# Patient Record
Sex: Male | Born: 1970 | Race: Black or African American | Hispanic: No | Marital: Married | Smoking: Never smoker
Health system: Southern US, Community
[De-identification: ages and names within clinical notes are randomized; demographics above are authoritative.]

## PROBLEM LIST (undated history)

## (undated) DIAGNOSIS — K219 Gastro-esophageal reflux disease without esophagitis: Secondary | ICD-10-CM

## (undated) DIAGNOSIS — I10 Essential (primary) hypertension: Secondary | ICD-10-CM

## (undated) DIAGNOSIS — R51 Headache: Secondary | ICD-10-CM

## (undated) DIAGNOSIS — Z87442 Personal history of urinary calculi: Secondary | ICD-10-CM

## (undated) DIAGNOSIS — R519 Headache, unspecified: Secondary | ICD-10-CM

## (undated) HISTORY — PX: SPLENECTOMY, PARTIAL: SHX787

## (undated) HISTORY — PX: KNEE SURGERY: SHX244

---

## 1999-06-19 ENCOUNTER — Emergency Department (HOSPITAL_COMMUNITY): Admission: EM | Admit: 1999-06-19 | Discharge: 1999-06-20 | Payer: Self-pay | Admitting: Emergency Medicine

## 1999-06-20 ENCOUNTER — Encounter: Payer: Self-pay | Admitting: Emergency Medicine

## 2000-06-11 ENCOUNTER — Emergency Department (HOSPITAL_COMMUNITY): Admission: EM | Admit: 2000-06-11 | Discharge: 2000-06-11 | Payer: Self-pay | Admitting: Emergency Medicine

## 2000-06-11 ENCOUNTER — Encounter: Payer: Self-pay | Admitting: Emergency Medicine

## 2008-05-17 ENCOUNTER — Encounter: Admission: RE | Admit: 2008-05-17 | Discharge: 2008-05-17 | Payer: Self-pay | Admitting: Cardiology

## 2010-12-31 ENCOUNTER — Encounter: Payer: Self-pay | Admitting: Neurosurgery

## 2015-05-26 ENCOUNTER — Emergency Department (HOSPITAL_COMMUNITY)
Admission: EM | Admit: 2015-05-26 | Discharge: 2015-05-26 | Disposition: A | Discharge status: Other Acute Inpatient Hospital | Payer: BLUE CROSS/BLUE SHIELD | Source: Home / Self Care | Attending: Family Medicine | Admitting: Family Medicine

## 2015-05-26 ENCOUNTER — Emergency Department (HOSPITAL_COMMUNITY): Payer: BLUE CROSS/BLUE SHIELD

## 2015-05-26 ENCOUNTER — Encounter (HOSPITAL_COMMUNITY): Payer: Self-pay | Admitting: *Deleted

## 2015-05-26 ENCOUNTER — Emergency Department (HOSPITAL_COMMUNITY)
Admission: EM | Admit: 2015-05-26 | Discharge: 2015-05-27 | Disposition: A | Discharge status: Home / Self Care | Payer: BLUE CROSS/BLUE SHIELD | Attending: Emergency Medicine | Admitting: Emergency Medicine

## 2015-05-26 DIAGNOSIS — R109 Unspecified abdominal pain: Secondary | ICD-10-CM

## 2015-05-26 DIAGNOSIS — R1013 Epigastric pain: Secondary | ICD-10-CM

## 2015-05-26 DIAGNOSIS — I10 Essential (primary) hypertension: Secondary | ICD-10-CM | POA: Insufficient documentation

## 2015-05-26 DIAGNOSIS — R188 Other ascites: Secondary | ICD-10-CM | POA: Diagnosis not present

## 2015-05-26 DIAGNOSIS — Z87442 Personal history of urinary calculi: Secondary | ICD-10-CM | POA: Diagnosis not present

## 2015-05-26 DIAGNOSIS — R1033 Periumbilical pain: Secondary | ICD-10-CM | POA: Insufficient documentation

## 2015-05-26 DIAGNOSIS — Z79899 Other long term (current) drug therapy: Secondary | ICD-10-CM | POA: Insufficient documentation

## 2015-05-26 DIAGNOSIS — R100 Acute abdomen: Secondary | ICD-10-CM

## 2015-05-26 DIAGNOSIS — R1012 Left upper quadrant pain: Secondary | ICD-10-CM | POA: Diagnosis not present

## 2015-05-26 DIAGNOSIS — K3189 Other diseases of stomach and duodenum: Secondary | ICD-10-CM

## 2015-05-26 HISTORY — DX: Essential (primary) hypertension: I10

## 2015-05-26 LAB — I-STAT TROPONIN, ED
TROPONIN I, POC: 0.01 ng/mL (ref 0.00–0.08)
TROPONIN I, POC: 0.02 ng/mL (ref 0.00–0.08)

## 2015-05-26 LAB — COMPREHENSIVE METABOLIC PANEL
ALK PHOS: 53 U/L (ref 38–126)
ALT: 14 U/L — AB (ref 17–63)
AST: 18 U/L (ref 15–41)
Albumin: 3.8 g/dL (ref 3.5–5.0)
Anion gap: 8 (ref 5–15)
BUN: 9 mg/dL (ref 6–20)
CO2: 24 mmol/L (ref 22–32)
Calcium: 8.8 mg/dL — ABNORMAL LOW (ref 8.9–10.3)
Chloride: 103 mmol/L (ref 101–111)
Creatinine, Ser: 1.2 mg/dL (ref 0.61–1.24)
GFR calc Af Amer: >60 mL/min (ref >60)
GFR calc non Af Amer: >60 mL/min (ref >60)
Glucose, Bld: 110 mg/dL — ABNORMAL HIGH (ref 65–99)
Potassium: 4.1 mmol/L (ref 3.5–5.1)
SODIUM: 135 mmol/L (ref 135–145)
Total Bilirubin: 2.1 mg/dL — ABNORMAL HIGH (ref 0.3–1.2)
Total Protein: 7.1 g/dL (ref 6.5–8.1)

## 2015-05-26 LAB — CBC WITH DIFFERENTIAL/PLATELET
BASOS PCT: 0 % (ref 0–1)
Basophils Absolute: 0 10*3/uL (ref 0.0–0.1)
EOS ABS: 0 10*3/uL (ref 0.0–0.7)
Eosinophils Relative: 0 % (ref 0–5)
HCT: 40.6 % (ref 39.0–52.0)
HEMOGLOBIN: 13.9 g/dL (ref 13.0–17.0)
Lymphocytes Relative: 23 % (ref 12–46)
Lymphs Abs: 2.7 10*3/uL (ref 0.7–4.0)
MCH: 27.3 pg (ref 26.0–34.0)
MCHC: 34.2 g/dL (ref 30.0–36.0)
MCV: 79.6 fL (ref 78.0–100.0)
MONO ABS: 1.2 10*3/uL — AB (ref 0.1–1.0)
Monocytes Relative: 10 % (ref 3–12)
NEUTROS PCT: 67 % (ref 43–77)
Neutro Abs: 7.7 10*3/uL (ref 1.7–7.7)
Platelets: 410 10*3/uL — ABNORMAL HIGH (ref 150–400)
RBC: 5.1 MIL/uL (ref 4.22–5.81)
RDW: 13.5 % (ref 11.5–15.5)
WBC: 11.6 10*3/uL — ABNORMAL HIGH (ref 4.0–10.5)

## 2015-05-26 LAB — POCT URINALYSIS DIP (DEVICE)
Glucose, UA: NEGATIVE mg/dL
Ketones, ur: NEGATIVE mg/dL
Leukocytes, UA: NEGATIVE
Nitrite: NEGATIVE
Protein, ur: 30 mg/dL — AB
Specific Gravity, Urine: >=1.03 (ref 1.005–1.030)
Urobilinogen, UA: 0.2 mg/dL (ref 0.0–1.0)
pH: 5.5 (ref 5.0–8.0)

## 2015-05-26 LAB — LIPASE, BLOOD: Lipase: 19 U/L — ABNORMAL LOW (ref 22–51)

## 2015-05-26 MED ORDER — HYDROMORPHONE HCL 1 MG/ML IJ SOLN
1.0000 mg | Freq: Once | INTRAMUSCULAR | Status: AC
  Administered 2015-05-26: 1 mg via INTRAVENOUS
  Filled 2015-05-26: qty 1

## 2015-05-26 MED ORDER — HYDROMORPHONE HCL 1 MG/ML IJ SOLN
1.0000 mg | Freq: Once | INTRAMUSCULAR | Status: AC
  Administered 2015-05-26: 1 mg via INTRAMUSCULAR

## 2015-05-26 MED ORDER — HYDROMORPHONE HCL 1 MG/ML IJ SOLN
INTRAMUSCULAR | Status: AC
  Filled 2015-05-26: qty 1

## 2015-05-26 MED ORDER — OXYCODONE-ACETAMINOPHEN 5-325 MG PO TABS: 1.0000 | ORAL_TABLET | Freq: Four times a day (QID) | ORAL | Status: DC | PRN

## 2015-05-26 MED ORDER — ONDANSETRON 4 MG PO TBDP: 4.0000 mg | ORAL_TABLET | Freq: Three times a day (TID) | ORAL | Status: DC | PRN

## 2015-05-26 MED ORDER — IOHEXOL 300 MG/ML  SOLN: 25.0000 mL | Freq: Once | INTRAMUSCULAR | Status: AC | PRN

## 2015-05-26 MED ORDER — AZITHROMYCIN 250 MG PO TABS
250.0000 mg | ORAL_TABLET | Freq: Every day | ORAL | Status: DC
Start: 2015-05-26 — End: 2015-06-10

## 2015-05-26 MED ORDER — ONDANSETRON HCL 4 MG/2ML IJ SOLN
4.0000 mg | Freq: Once | INTRAMUSCULAR | Status: AC
  Administered 2015-05-26: 4 mg via INTRAVENOUS
  Filled 2015-05-26: qty 2

## 2015-05-26 MED ORDER — IOHEXOL 300 MG/ML  SOLN
100.0000 mL | Freq: Once | INTRAMUSCULAR | Status: AC | PRN
  Administered 2015-05-26: 100 mL via INTRAVENOUS

## 2015-05-26 MED ORDER — AZITHROMYCIN 250 MG PO TABS
500.0000 mg | ORAL_TABLET | Freq: Once | ORAL | Status: AC
  Administered 2015-05-26: 500 mg via ORAL
  Filled 2015-05-26: qty 2

## 2015-05-26 NOTE — ED Notes (Signed)
Pt in c/o epigastric pain that is worse with movement or coughing, pain started around 4am today, denies n/v, pt reports episodes of diaphoresis, episodes of dizziness

## 2015-05-26 NOTE — Discharge Instructions (Signed)
Please read and follow all provided instructions.  Your diagnoses today include:  1. Gastric mass   2. Epigastric pain   3. Abdominal pain, acute   4. Ascites     Tests performed today include:  Blood counts and electrolytes - slightly high white blood cell count  Blood tests to check liver and kidney function - shows normal liver function  Blood tests to check pancreas function - normal  Urine test to look for infection  CT scan - shows a mass on the stomach that measures 13cm x 10cm x 5cm with inflammation of the diaphragm and some fluid around the liver  Vital signs. See below for your results today.   Medications prescribed:   Percocet (oxycodone/acetaminophen) - narcotic pain medication  DO NOT drive or perform any activities that require you to be awake and alert because this medicine can make you drowsy. BE VERY CAREFUL not to take multiple medicines containing Tylenol (also called acetaminophen). Doing so can lead to an overdose which can damage your liver and cause liver failure and possibly death.  Take any prescribed medications only as directed.  Home care instructions:   Follow any educational materials contained in this packet.  Follow-up instructions:  Please follow-up with Dr. Erlinda Hong office tomorrow.     Return instructions:  SEEK IMMEDIATE MEDICAL ATTENTION IF:  The pain does not go away or becomes severe   A temperature above 101F develops   Repeated vomiting occurs (multiple episodes)   The pain becomes localized to portions of the abdomen. The right side could possibly be appendicitis. In an adult, the left lower portion of the abdomen could be colitis or diverticulitis.   Blood is being passed in stools or vomit (bright red or black tarry stools)   You develop chest pain, difficulty breathing, dizziness or fainting, or become confused, poorly responsive, or inconsolable (young children)  If you have any other emergent concerns regarding  your health  Additional Information: Abdominal (belly) pain can be caused by many things. Your caregiver performed an examination and possibly ordered blood/urine tests and imaging (CT scan, x-rays, ultrasound). Many cases can be observed and treated at home after initial evaluation in the emergency department. Even though you are being discharged home, abdominal pain can be unpredictable. Therefore, you need a repeated exam if your pain does not resolve, returns, or worsens. Most patients with abdominal pain don't have to be admitted to the hospital or have surgery, but serious problems like appendicitis and gallbladder attacks can start out as nonspecific pain. Many abdominal conditions cannot be diagnosed in one visit, so follow-up evaluations are very important.  Your vital signs today were: BP 142/86 mmHg   Pulse 77   Temp(Src) 98 F (36.7 C) (Oral)   Resp 12   SpO2 97% If your blood pressure (bp) was elevated above 135/85 this visit, please have this repeated by your doctor within one month. --------------

## 2015-05-26 NOTE — ED Provider Notes (Signed)
CSN: [id number]     Arrival date & time 05/26/15  1755 History   First MD Initiated Contact with Patient 05/26/15 1812     Chief Complaint  Patient presents with  . 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 Pain is constant. Pain woke patient from sleep. Patient went on to work this morning and the symptoms got worse so he went to urgent care and was referred to the emergency department for further evaluation. Patient denies fever, nausea, vomiting, diarrhea. Patient's urine appears dark but he has no dysuria or gross hematuria. Patient had 2 episodes of diaphoresis with the pain while at work today. He denies chest pain however the pain is worse when he takes deep breath in or coughs. No hemoptysis or history of blood clots. No shortness of breath. Patient has had a history of kidney stones but that pain was in his flank. No heavy NSAID use. Patient does not drink. No color change of his eyes noted. Pain does not radiate to his back or shoulder blades. Patient does note that for the past several weeks he has had indigestion after eating with increased belching.  Patient is a 44 y.o. male presenting with abdominal pain. The history is provided by the patient.  Abdominal Pain Associated symptoms: no chest pain, no cough, no diarrhea, no dysuria, no fever, no nausea, no shortness of breath, no sore throat and no vomiting     Past Medical History  Diagnosis Date  . Hypertension   . Kidney stones    Past Surgical History  Procedure Laterality Date  . Splenectomy, partial    . Knee surgery     History reviewed. No pertinent family history. History  Substance Use Topics  . Smoking status: Never Smoker   . Smokeless tobacco: Not on file  . Alcohol Use: No      Review of Systems  Constitutional: Negative for fever.  HENT: Negative for rhinorrhea and sore throat.   Eyes: Negative for redness.  Respiratory: Negative for cough, shortness of breath and wheezing.   Cardiovascular: Negative for chest pain.  Gastrointestinal: Positive for abdominal pain. Negative for nausea, vomiting and diarrhea.  Genitourinary: Negative for dysuria.  Musculoskeletal: Negative for myalgias.  Skin: Negative for rash.  Neurological: Negative for headaches.      Allergies  Review of patient's allergies indicates no known allergies.  Home Medications   Prior to Admission medications   Medication Sig Start Date End Date Taking? Authorizing Provider  amLODipine-valsartan (EXFORGE) 5-320 MG per tablet Take 1 tablet by mouth daily.    Historical Provider, MD   BP 138/100 mmHg  Pulse 90  Temp(Src) 98 F (36.7 C) (Oral)  Resp 20  SpO2 100%   Physical Exam  Constitutional: He appears well-developed and well-nourished.  HENT:  Head: Normocephalic and atraumatic.  Eyes: Conjunctivae are normal. Right eye exhibits no discharge. Left eye exhibits no discharge.  Neck: Normal range of motion. Neck supple.  Cardiovascular: Normal rate, regular rhythm and normal heart sounds.   No murmur heard. Pulmonary/Chest: Effort normal and breath sounds normal. No respiratory distress. He has no wheezes. He has no rales.  Abdominal: Soft. Bowel sounds are decreased. There is tenderness in the epigastric area, periumbilical area and left upper quadrant. There is no rigidity, no rebound, no guarding, no CVA  tenderness, no tenderness at McBurney's point and negative Murphy's sign.    Neurological: He is alert.  Skin: Skin is warm and dry.  Psychiatric: He has a normal mood and affect.  Nursing note and vitals reviewed.   ED Course  Procedures (including critical care time) Labs Review Labs Reviewed  CBC WITH DIFFERENTIAL/PLATELET - Abnormal; Notable for the following:     WBC 11.6 (*)    Platelets 410 (*)    Monocytes Absolute 1.2 (*)    All other components within normal limits  LIPASE, BLOOD - Abnormal; Notable for the following:    Lipase 19 (*)    All other components within normal limits  COMPREHENSIVE METABOLIC PANEL - Abnormal; Notable for the following:    Glucose, Bld 110 (*)    Calcium 8.8 (*)    ALT 14 (*)    Total Bilirubin 2.1 (*)    All other components within normal limits  I-STAT TROPOININ, ED  I-STAT TROPOININ, ED    Imaging Review Ct Abdomen Pelvis W Contrast  05/26/2015   CLINICAL DATA:  Acute onset of epigastric abdominal pain. Initial encounter.  EXAM: CT ABDOMEN AND PELVIS WITH CONTRAST  TECHNIQUE: Multidetector CT imaging of the abdomen and pelvis was performed using the standard protocol following bolus administration of intravenous contrast.  CONTRAST:  175mL OMNIPAQUE IOHEXOL 300 MG/ML  SOLN  COMPARISON:  Abdominal radiograph performed earlier today at 6:40 p.m.  FINDINGS: Patchy bibasilar airspace opacities may reflect atelectasis or pneumonia.  There is focal circumferential wall thickening at the antrum of the stomach and pylorus, with surrounding soft tissue inflammation and prominent nodes, most compatible with a primary gastric malignancy. The nodes measure up to 9 mm in short axis.  A large amount of irregular mass is noted at the anterior aspect of the left upper quadrant, layering along the left hemidiaphragm, measuring approximately 13.4 x 10.5 x 5.5 cm. This is compatible with peritoneal carcinomatosis. Mild underlying soft tissue edema and increased vascularity are seen about the left upper quadrant.  A small amount of high density ascites is noted tracking about the abdomen and pelvis, likely reflecting the peritoneal carcinomatosis. No definite additional peritoneal implants are seen.  The liver is unremarkable in appearance. The patient is status post splenectomy. Several splenules are noted posteriorly at the left upper  quadrant. The gallbladder is unremarkable in appearance. The pancreas is grossly unremarkable, though it abuts the gastric mass. The adrenal glands are within normal limits.  A 1.0 cm stone is noted near the upper pole of the right kidney. A 5 mm stone is noted at the interpole region of the left kidney. There is no evidence of hydronephrosis. No obstructing ureteral stones are seen. No perinephric stranding is appreciated.  The small bowel is unremarkable in appearance. No acute vascular abnormalities are seen.  The appendix is grossly unremarkable in appearance, though difficult to fully characterize. There is no evidence for appendicitis. The colon is unremarkable in appearance.  The bladder is mildly distended and grossly unremarkable in appearance. The prostate remains normal in size. No inguinal lymphadenopathy is seen.  No acute osseous abnormalities are identified.  IMPRESSION: 1. Focal circumferential wall thickening at the antrum of the stomach and pylorus, with surrounding soft tissue inflammation and prominent nodes, compatible with a primary gastric malignancy. 2. Large amount of irregular mass at the anterior aspect of left upper quadrant, layering along the left hemidiaphragm, measuring approximately 13.4 x 10.5 x 5.5 cm. This is compatible with peritoneal carcinomatosis.  Underlying soft tissue edema and increased vascularity about the left upper quadrant. 3. Small amount of high density ascites noted tracking about the abdomen and pelvis, likely reflecting the peritoneal carcinomatosis. 4. Patchy bibasilar airspace opacities may reflect atelectasis or pneumonia. 5. Bilateral nonobstructing renal stones noted.  These results were called by telephone at the time of interpretation on 05/26/2015 at 10:23 pm to [facility] PA, who verbally acknowledged these results.   Electronically Signed   By: Garald Balding M.D.   On: 05/26/2015 22:29   Dg Abd Acute W/chest  05/26/2015   CLINICAL DATA:  Cold  sweats and epigastric pain  EXAM: DG ABDOMEN ACUTE W/ 1V CHEST  COMPARISON:  None.  FINDINGS: Cardiac shadow is within normal limits. Bibasilar atelectatic changes are seen. No focal confluent infiltrate is noted.  Scattered large and small bowel gas is noted. No obstructive changes are noted. There are calcifications seen bilaterally. The calcifications on the left measure approximately 7 mm in greatest dimension and likely represent left renal calculi. The calcification on the right measures 10 mm in greatest dimension and may also represent a renal calculus no bony abnormality is seen.  IMPRESSION: Nonspecific chest and abdomen. Bibasilar atelectatic changes are seen as well as changes consistent with bilateral renal calculi.   Electronically Signed   By: Inez Catalina M.D.   On: 05/26/2015 19:11     EKG Interpretation   Date/Time:  Thursday May 26 2015 18:18:08 EDT Ventricular Rate:  83 PR Interval:  158 QRS Duration: 80 QT Interval:  368 QTC Calculation: 432 R Axis:   0 Text Interpretation:  Sinus rhythm Probable anteroseptal infarct, recent  inferior lateral ST depressions No previous ECGs available Confirmed by  Wyvonnia Dusky  MD, STEPHEN [phone]) on 05/26/2015 6:30:41 PM       6:33 PM Patient seen and examined. Work-up initiated. Medications ordered. EKG reviewed. Work-up pending. Will start with acute abd series. Pain currently controlled.   Vital signs reviewed and are as follows: BP 138/100 mmHg  Pulse 90  Temp(Src) 98 F (36.7 C) (Oral)  Resp 20  SpO2 100%  8:56 PM Pt updated. Exam unchanged. Will CT. Pt agrees. Still declines pain medications. He is hungry.   11:14 PM Spoke with radiologist in regards to above findings on CT scan.  I spoke with Dr. Paulita Fujita of [facility] gastroenterology who recommends that patient call tomorrow for appointment and to plan EGD.   Patient and family informed of CT findings. Discussed plan. Patient's pain is currently controlled. They are in agreement  with the plan at this point.  Patient urged to return with worsening symptoms or other concerns. Patient verbalized understanding and agrees with plan.   BP 120/62 mmHg  Pulse 74  Temp(Src) 98 F (36.7 C) (Oral)  Resp 25  SpO2 96%  11:18 PM Repeat EKG stable.   MDM   Final diagnoses:  Epigastric pain  Abdominal pain, acute  Gastric mass  Ascites   Patient with a diagnosis of gastric mass, possible malignancy, plan outpatient GI follow-up for tissue diagnosis and referral for appropriate treatment.    Carlisle Cater, PA-C 05/26/15 2318  Ezequiel Essex, MD 05/27/15 [phone]

## 2015-05-26 NOTE — ED Notes (Signed)
Patient transported to X-ray 

## 2015-05-26 NOTE — ED Notes (Signed)
Npo

## 2015-05-26 NOTE — ED Provider Notes (Signed)
CSN: [id number]     Arrival date & time 05/26/15  1537 History   First MD Initiated Contact with Patient 05/26/15 1707     Chief Complaint  Patient presents with  . Abdominal Pain   (Consider location/radiation/quality/duration/timing/severity/associated sxs/prior Treatment) HPI Comments: 44 year old male awoke this morning approximately 0400 hrs. with pain in the epigastrium that radiates to the left hemiabdomen. Reportedly diaphoretic.  The pain does not radiate to the back. He has had no nausea vomiting or diarrhea. Coughing and taking a deep breath makes it worse. Nothing makes it better.  Patient is a 44 y.o. male presenting with abdominal pain.  Abdominal Pain Associated symptoms: no constipation, no diarrhea, no nausea and no vomiting     Past Medical History  Diagnosis Date  . Hypertension   . Kidney stones    Past Surgical History  Procedure Laterality Date  . Splenectomy, partial    . Knee surgery     History reviewed. No pertinent family history. History  Substance Use Topics  . Smoking status: Never Smoker   . Smokeless tobacco: Not on file  . Alcohol Use: No    Review of Systems  Constitutional: Negative.   HENT: Negative.   Respiratory: Negative.   Cardiovascular: Negative.   Gastrointestinal: Positive for abdominal pain. Negative for nausea, vomiting, diarrhea, constipation and abdominal distention.  Genitourinary: Negative.   Musculoskeletal: Negative.   Neurological: Negative.     Allergies  Review of patient's allergies indicates no known allergies.  Home Medications   Prior to Admission medications   Medication Sig Start Date End Date Taking? Authorizing Provider  amLODipine-valsartan (EXFORGE) 5-320 MG per tablet Take 1 tablet by mouth daily.   Yes Historical Provider, MD   BP 141/94 mmHg  Pulse 87  Temp(Src) 98 F (36.7 C) (Oral)  Resp 24  SpO2 99% Physical Exam  Constitutional: He is oriented to person, place, and time. He appears  well-developed and well-nourished. He appears distressed.  Appears to be in moderate to severe pain. Difficult to remain still.   Neck: Normal range of motion.  Cardiovascular: Normal rate, regular rhythm and normal heart sounds.   Pulmonary/Chest: Effort normal and breath sounds normal. No respiratory distress. She has no wheezes.  Abdominal: She exhibits no distension. There is tenderness. There is guarding. There is no rebound.  Bowel sounds hypoactive. Patient is guarding in the epigastrium and left hemiabdomen. Right abdomen soft and nontender to mild tenderness. Percussion produces pain to the epigastrium and left hemiabdomen.  Musculoskeletal: She exhibits no edema.  Neurological: She is alert and oriented to person, place, and time.  Skin: Skin is warm. He is not diaphoretic.  Psychiatric: She has a normal mood and affect.  Nursing note and vitals reviewed.   ED Course  Procedures (including critical care time) Labs Review Labs Reviewed - No data to display  Imaging Review No results found.   MDM   1. Acute abdominal pain    Transfer to [place] via shuttle for acute abdominal pain with peritoneal signs. Dilaudid 1 mg IM    Janne Napoleon, NP 05/26/15 1727

## 2015-05-26 NOTE — ED Notes (Signed)
Patient transported to CT 

## 2015-05-26 NOTE — ED Notes (Signed)
Pt  Reports  Epigastric  Pain  Started  This  Am   Pt became  Pale  /  Sweaty       Symptoms  Began   this  Am            No   Vomiting   No   Diarrhea

## 2015-05-27 ENCOUNTER — Encounter: Payer: Self-pay | Admitting: *Deleted

## 2015-05-27 DIAGNOSIS — C169 Malignant neoplasm of stomach, unspecified: Secondary | ICD-10-CM

## 2015-05-27 NOTE — [facility] Oncology Navigator Note (Signed)
Received referral via inbasket from Dr. Ezequiel Essex, ED physician for oncology referral for probable gastric cancer. Dr. Paulita Fujita was also notified to schedule him for EGD. Will need tissue diagnosis.

## 2015-06-01 ENCOUNTER — Encounter: Payer: Self-pay | Admitting: *Deleted

## 2015-06-01 NOTE — [facility] Oncology Navigator Note (Signed)
Left VM with Carmell Austria at Dr. Erlinda Hong office requesting EGD procedure report/pathology. Received referral from ER physician to see medical oncology.  May be able to see him on 7/1 at [facility] [place].

## 2015-06-09 ENCOUNTER — Telehealth: Payer: Self-pay | Admitting: *Deleted

## 2015-06-09 NOTE — Telephone Encounter (Signed)
Oncology Nurse Navigator Documentation  Oncology Nurse Navigator Flowsheets 06/09/2015  Referral date to RadOnc/MedOnc 05/27/2015  Navigator Encounter Type Introductory phone call  Barriers/Navigation Needs No barriers at this time  Time Spent with Patient 10  Called patient to confirm his appointment tomorrow at [facility]-unable to come in for [place] visit with Dr. Barry Dienes.  Does not have coverage until 0900 tomorrow. Instructed him to come at 0930 and he will see Dr. Burr Medico 1st then surgeon afterwards. He understands and agrees-gave him my contact # in case he is not able to get here.

## 2015-06-10 ENCOUNTER — Ambulatory Visit (HOSPITAL_BASED_OUTPATIENT_CLINIC_OR_DEPARTMENT_OTHER): Payer: BLUE CROSS/BLUE SHIELD | Admitting: Hematology

## 2015-06-10 ENCOUNTER — Ambulatory Visit: Payer: BLUE CROSS/BLUE SHIELD | Admitting: Physical Therapy

## 2015-06-10 ENCOUNTER — Ambulatory Visit: Payer: BLUE CROSS/BLUE SHIELD | Admitting: Nutrition

## 2015-06-10 ENCOUNTER — Ambulatory Visit: Payer: BLUE CROSS/BLUE SHIELD

## 2015-06-10 ENCOUNTER — Telehealth: Payer: Self-pay | Admitting: Hematology

## 2015-06-10 ENCOUNTER — Encounter: Payer: Self-pay | Admitting: *Deleted

## 2015-06-10 VITALS — BP 153/94 | HR 79 | Temp 98.8°F | Resp 16 | Ht 74.0 in | Wt 206.8 lb

## 2015-06-10 DIAGNOSIS — R1902 Left upper quadrant abdominal swelling, mass and lump: Secondary | ICD-10-CM

## 2015-06-10 DIAGNOSIS — K3189 Other diseases of stomach and duodenum: Secondary | ICD-10-CM

## 2015-06-10 DIAGNOSIS — K319 Disease of stomach and duodenum, unspecified: Secondary | ICD-10-CM

## 2015-06-10 NOTE — [facility] Oncology Navigator Note (Signed)
Oncology Nurse Navigator Documentation  Oncology Nurse Navigator Flowsheets 06/10/2015  Referral date to RadOnc/MedOnc -  Navigator Encounter Type Clinic/[facility]  Patient Visit Type Initial visit w/medical oncology and surgery  Treatment Phase Abnormal Scans  Barriers/Navigation Needs Education-PET scan, tumor markers  Time Spent with Patient 30  Met with patient after new clinic patient visit. Explained the role of the GI Nurse Navigator and provided New Patient Packet with information on: 1. Anatomy of stomach-he declined any info on cancer at this time 2. Support groups 3. Advanced Directives 4. Fall Safety Plan Answered questions, reviewed current treatment plan using TEACH back and provided emotional support. Provided copy of current treatment plan. Was seen by CSW and Dietician today.  Merceda Elks, RN, BSN [facility] [place]

## 2015-06-10 NOTE — Progress Notes (Signed)
Patient was seen in [facility].  44 year old male diagnosed with gastric mass.  He is a patient of Dr. Burr Medico.  Past medical history includes HTN and kidney stones.  Medications include zofran and prilosec.  Labs include Glucose of 110 on 6/16.  Height: 6'2". Weight: 206.8 pounds. UBW: 235 pounds. BMI: 26.54.  Patient seen in [facility]. States he started to eliminate concentrated sweets and sodas in March because he wanted to lose weight to help lower his blood pressure. Patient has lost 28 pounds since March. Reports some pain and early satiety.  States he belches a lot after eating.  Nutrition Diagnosis:   Food and Nutrition Related Knowledge Deficit related to gastric mass as evidenced by no prior need for nutrition related information.  Intervention: Patient educated to consume small, frequent meals and snacks to promote weight maintenance. Provided fact sheet on increasing calories and protein. Questions answered and teach back method used.  Monitoring, Evaluation, and Goals: Patient will increase oral intake to maintain weight.  Next Visit:  To be scheduled as needed.

## 2015-06-10 NOTE — Progress Notes (Signed)
[place]  Telephone:(336) [phone] Fax:(336) [phone]  Clinic New Consult Note   No care team member to display 06/10/2015  CHIEF COMPLAINTS/PURPOSE OF CONSULTATION:  Abnormal CT scan   HISTORY OF PRESENTING ILLNESS:  Johnny Barker 44 y.o. male is here because of a recent abnormal CT scan, which showed a left upper quadrant abdominal mass.  He presented with sudden onset severe epigastric pain with radiation to his left abdomen started at 4:00 on 05/26/2015. It subsided after a few hours, and then he went to his work. Due to the morning he had a few episodes of diaphoresis, persistent abdominal pain, so he went to urgent care and was subsequently transferred to [facility] emergency department. He underwent a CT of the abdomen, which showed a large soft tissue mass in the left upper quadrant of his abdomen. The rest of workup was negative. Patient did not want to be admitted, and was referred to gastroenterologist Dr. Paulita Fujita, who saw him the next day. He underwent upper endoscopy, which showed multiple duodenum ulcers, and biopsy was negative for malignant cells. He was started on Prilosec twice daily.  His abdominal pain has significantly improved, he is eating normally without issues. His bowel movement is normal. He denies any other symptoms, no nausea, no change of his bowel habits. no fever chills or night sweats. He intentionally lost about 20 pounds in the past 3 months, due to diet change for his hypertension.  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 He does not remember the exact reason for the surgery. He states he has some epistaxis, which resolved after splenectomy. He vaguely remember he might have some issue with his spleen (? Infarct or injury), but not certain.   MEDICAL HISTORY:  Past Medical History  Diagnosis Date  . Hypertension   . Kidney stones     SURGICAL HISTORY: Past Surgical History  Procedure Laterality Date  . Splenectomy,  partial    . Knee surgery      SOCIAL HISTORY: History   Social History  . Marital Status: Married    Spouse Name: N/A  . Number of Children: N/A  . Years of Education: N/A   Occupational History  . Not on file.   Social History Main Topics  . Smoking status: Never Smoker   . Smokeless tobacco: Not on file  . Alcohol Use: No  . Drug Use: Not on file  . Sexual Activity: Not on file   Other Topics Concern  . Not on file   Social History Narrative    FAMILY HISTORY: No family history on file.  ALLERGIES:  has No Known Allergies.  MEDICATIONS:  Current Outpatient Prescriptions  Medication Sig Dispense Refill  . amLODipine-valsartan (EXFORGE) 5-320 MG per tablet Take 1 tablet by mouth daily.    Marland Kitchen aspirin-acetaminophen-caffeine (EXCEDRIN MIGRAINE) 250-250-65 MG per tablet Take 2 tablets by mouth every 8 (eight) hours as needed for headache.    Marland Kitchen azithromycin (ZITHROMAX) 250 MG tablet Take 1 tablet (250 mg total) by mouth daily. 4 tablet 0  . cetirizine (ZYRTEC) 10 MG chewable tablet Chew 10 mg by mouth as needed for allergies.    . famotidine (PEPCID AC) 10 MG chewable tablet Chew 10 mg by mouth 2 (two) times daily as needed for heartburn.    . ondansetron (ZOFRAN ODT) 4 MG disintegrating tablet Take 1 tablet (4 mg total) by mouth every 8 (eight) hours as needed for nausea or vomiting. 10 tablet 0  .  oxyCODONE-acetaminophen (PERCOCET/ROXICET) 5-325 MG per tablet Take 1-2 tablets by mouth every 6 (six) hours as needed for severe pain. 20 tablet 0   No current facility-administered medications for this visit.    REVIEW OF SYSTEMS:   Constitutional: Denies fevers, chills or abnormal night sweats Eyes: Denies blurriness of vision, double vision or watery eyes Ears, nose, mouth, throat, and face: Denies mucositis or sore throat Respiratory: Denies cough, dyspnea or wheezes Cardiovascular: Denies palpitation, chest discomfort or lower extremity swelling Gastrointestinal:   Denies nausea, heartburn or change in bowel habits Skin: Denies abnormal skin rashes Lymphatics: Denies new lymphadenopathy or easy bruising Neurological:Denies numbness, tingling or new weaknesses Behavioral/Psych: Mood is stable, no new changes  All other systems were reviewed with the patient and are negative.  PHYSICAL EXAMINATION: ECOG PERFORMANCE STATUS: 0 - Asymptomatic BP 153/94 mmHg  Pulse 79  Temp(Src) 98.8 F (37.1 C) (Oral)  Resp 16  Ht 6\' 2"  (1.88 m)  Wt 206 lb 12.8 oz (93.804 kg)  BMI 26.54 kg/m2  SpO2 100%  GENERAL:alert, no distress and comfortable SKIN: skin color, texture, turgor are normal, no rashes or significant lesions EYES: normal, conjunctiva are pink and non-injected, sclera clear OROPHARYNX:no exudate, no erythema and lips, buccal mucosa, and tongue normal  NECK: supple, thyroid normal size, non-tender, without nodularity LYMPH:  no palpable lymphadenopathy in the cervical, axillary or inguinal LUNGS: clear to auscultation and percussion with normal breathing effort HEART: regular rate & rhythm and no murmurs and no lower extremity edema ABDOMEN:abdomen soft, non-tender and normal bowel sounds Musculoskeletal:no cyanosis of digits and no clubbing  PSYCH: alert & oriented x 3 with fluent speech NEURO: no focal motor/sensory deficits  LABORATORY DATA:  I have reviewed the data as listed Lab Results  Component Value Date   WBC 11.6* 05/26/2015   HGB 13.9 05/26/2015   HCT 40.6 05/26/2015   MCV 79.6 05/26/2015   PLT 410* 05/26/2015    Recent Labs  05/26/15 1930  NA 135  K 4.1  CL 103  CO2 24  GLUCOSE 110*  BUN 9  CREATININE 1.20  CALCIUM 8.8*  GFRNONAA >60  GFRAA >60  PROT 7.1  ALBUMIN 3.8  AST 18  ALT 14*  ALKPHOS 53  BILITOT 2.1*   PATHOLOGY Duodenum, biopsy, ulcer  05/30/2015 -Peptic 2 tendinitis with erosions. -No dysplasia or malignancy identified.  RADIOGRAPHIC STUDIES: I have personally reviewed the radiological images as  listed and agreed with the findings in the report.  Ct Abdomen Pelvis W Contrast 05/26/2015    IMPRESSION: 1. Focal circumferential wall thickening at the antrum of the stomach and pylorus, with surrounding soft tissue inflammation and prominent nodes, compatible with a primary gastric malignancy. 2. Large amount of irregular mass at the anterior aspect of left upper quadrant, layering along the left hemidiaphragm, measuring approximately 13.4 x 10.5 x 5.5 cm. This is compatible with peritoneal carcinomatosis. Underlying soft tissue edema and increased vascularity about the left upper quadrant. 3. Small amount of high density ascites noted tracking about the abdomen and pelvis, likely reflecting the peritoneal carcinomatosis. 4. Patchy bibasilar airspace opacities may reflect atelectasis or pneumonia. 5. Bilateral nonobstructing renal stones noted.  These results were called by telephone at the time of interpretation on 05/26/2015 at 10:23 pm to [facility], who verbally acknowledged these results.   Electronically Signed   By: Garald Balding M.D.   On: 05/26/2015 22:29   Upper GI endoscopy 05/30/2015 by Dr. Paulita Fujita Impression -Normal esophagus -Gastritis  -Multiple  nonbleeding duodenal ulcers. Biopsied. Microscopically no findings to support malignancy. I wonder whether patient had self-contained duodenal ulcer perforation, which might account for his CT scan findings. -Erythematous due to adenopathy -The exam was otherwise normal   ASSESSMENT & PLAN: 44 year old African-American male with past medical history of hypertension  1. Left upper quadrant abdominal mass -I reviewed his CT scan findings with patient in details -He did have a CT abdomen without contrast in 2000 for kidney stone, which showed some small nodular soft tissue density in the left upper abdomen. Although the image was not available for review, it appears the soft tissue in the left upper abdomen has significantly increased in  size -We discussed this at this could be an indolent tumor, such as low-grade non-Hodgkin's lymphoma, or benign lesions, residual spleen tissue is also possible. -I recommend to have a PET CT scan for further evaluation. An likely would recommend a needle biopsy or laparoscopic surgical biopsy, he was seen by Dr. Barry Dienes today [facility] check CEA and CA-19-9 tumor markers   2. Slightly elevated bilirubin  -He is asymptomatic, normal liver enzymes  -We'll check direct bilirubin   3. Duodenal ulcers  -His episodes of abdominal pain is likely related to self-contained duodenal ulcer perforation  -Continue PPI, follow-up with Dr. Paulita Fujita  Plan -lab today -PET -RTC in 2-3 weeks after PET  All questions were answered. The patient knows to call the clinic with any problems, questions or concerns. I spent 40 minutes counseling the patient face to face. The total time spent in the appointment was 55 minutes and more than 50% was on counseling.     Truitt Merle, MD 06/10/2015 7:19 AM

## 2015-06-10 NOTE — Progress Notes (Signed)
[facility]  Psychosocial Distress Screening Clinical Social Work  Clinical Social Work met with pt at [facility] to introduce self, explain role of CSW/Pt and Family Support team and review distress screening protocol.  Pt still has testing to determine his treatment plan. The not knowing is the most difficult and anxiety producing event for him currently. The patient scored a 2 on the Psychosocial Distress Thermometer which indicates no distress. Pt works at [employer] and has good support from him wife, extended family and his 81yo daughter. CSW reviewed resources, GI Support group and other coping techniques to assist with anxiety. Pt denies current practical concerns at this time. Pt agrees to reach out as needed and was appreciative of support today.   Clinical Social Worker follow up needed: No.  If yes, follow up plan: Johnny Barker, [place]  [facility] Phone: [phone] Fax: [phone]

## 2015-06-10 NOTE — Telephone Encounter (Signed)
Gave and printed appt sched and avs for pt for July  °

## 2015-06-11 ENCOUNTER — Encounter: Payer: Self-pay | Admitting: Hematology

## 2015-06-11 DIAGNOSIS — IMO0002 Reserved for concepts with insufficient information to code with codable children: Secondary | ICD-10-CM | POA: Insufficient documentation

## 2015-06-11 DIAGNOSIS — S3692XA Contusion of unspecified intra-abdominal organ, initial encounter: Secondary | ICD-10-CM

## 2015-06-14 ENCOUNTER — Telehealth: Payer: Self-pay | Admitting: Hematology

## 2015-06-14 ENCOUNTER — Other Ambulatory Visit: Payer: Self-pay | Admitting: *Deleted

## 2015-06-14 LAB — CEA

## 2015-06-14 LAB — BILIRUBIN,DIRECT & INDIRECT (FRACTIONATED)
BILIRUBIN INDIRECT: 0.5 mg/dL (ref 0.2–1.2)
Bilirubin, Direct: 0.3 mg/dL (ref 0.0–0.3)

## 2015-06-14 LAB — CANCER ANTIGEN 19-9: CA 19-9: 11.6 U/mL (ref <35.0)

## 2015-06-14 LAB — BILIRUBIN, TOTAL: Total Bilirubin: 0.8 mg/dL (ref 0.2–1.2)

## 2015-06-14 NOTE — Telephone Encounter (Signed)
Confirmed appointment for 07/25

## 2015-06-14 NOTE — Progress Notes (Signed)
Received call from pt, requesting to reschedule MD appt with Dr. Burr Medico until after PET scan on 7/19. POF sent to scheduler.

## 2015-06-17 ENCOUNTER — Telehealth: Payer: Self-pay | Admitting: *Deleted

## 2015-06-17 NOTE — Telephone Encounter (Signed)
Oncology Nurse Navigator Documentation  Oncology Nurse Navigator Flowsheets 06/17/2015  Referral date to RadOnc/MedOnc -  Navigator Encounter Type Telephone-1 week F/U  Patient Visit Type -  Treatment Phase -  Barriers/Navigation Needs No barriers at this time  Time Spent with Patient (Retired) 5  Notified Vidur that his lab work on 06/10/15 all returned in normal range. Confirmed his PET scan appointment and follow up with Dr. Burr Medico. He appreciates the update. Feeling well with no issues at this time.

## 2015-06-28 ENCOUNTER — Ambulatory Visit: Payer: BLUE CROSS/BLUE SHIELD | Admitting: Hematology

## 2015-06-28 ENCOUNTER — Ambulatory Visit (HOSPITAL_COMMUNITY)
Admission: RE | Admit: 2015-06-28 | Discharge: 2015-06-28 | Disposition: A | Discharge status: Home / Self Care | Payer: BLUE CROSS/BLUE SHIELD | Source: Ambulatory Visit | Attending: Hematology | Admitting: Hematology

## 2015-06-28 DIAGNOSIS — K319 Disease of stomach and duodenum, unspecified: Secondary | ICD-10-CM | POA: Insufficient documentation

## 2015-06-28 DIAGNOSIS — K3189 Other diseases of stomach and duodenum: Secondary | ICD-10-CM

## 2015-06-28 LAB — GLUCOSE, CAPILLARY: Glucose-Capillary: 78 mg/dL (ref 65–99)

## 2015-06-28 MED ORDER — FLUDEOXYGLUCOSE F - 18 (FDG) INJECTION
10.0000 | Freq: Once | INTRAVENOUS | Status: AC | PRN
  Administered 2015-06-28: 10 via INTRAVENOUS

## 2015-07-04 ENCOUNTER — Encounter: Payer: BLUE CROSS/BLUE SHIELD | Admitting: Hematology

## 2015-07-04 ENCOUNTER — Other Ambulatory Visit: Payer: Self-pay | Admitting: *Deleted

## 2015-07-04 ENCOUNTER — Telehealth: Payer: Self-pay | Admitting: Hematology

## 2015-07-04 ENCOUNTER — Telehealth: Payer: Self-pay | Admitting: *Deleted

## 2015-07-04 NOTE — Progress Notes (Signed)
No show, rescheduled  This encounter was created in error - please disregard.

## 2015-07-04 NOTE — Telephone Encounter (Signed)
Called pt & spoke with daughter about missed appt today.  Daughter reports that pt is at work & working late today & thought his appt was tomorrow.  R/s missed appt to 9:15 am tomorrow with Dr Burr Medico & she states pt should be able to make this appt.

## 2015-07-04 NOTE — Telephone Encounter (Signed)
per Janifer Adie to chge pt appt to 7/26-she will call pt to make aware

## 2015-07-05 ENCOUNTER — Encounter: Payer: Self-pay | Admitting: Hematology

## 2015-07-05 ENCOUNTER — Telehealth: Payer: Self-pay | Admitting: Hematology

## 2015-07-05 ENCOUNTER — Ambulatory Visit (HOSPITAL_BASED_OUTPATIENT_CLINIC_OR_DEPARTMENT_OTHER): Payer: BLUE CROSS/BLUE SHIELD | Admitting: Hematology

## 2015-07-05 VITALS — BP 157/94 | HR 71 | Temp 98.7°F | Resp 17 | Ht 74.0 in | Wt 206.8 lb

## 2015-07-05 DIAGNOSIS — R1902 Left upper quadrant abdominal swelling, mass and lump: Secondary | ICD-10-CM | POA: Diagnosis not present

## 2015-07-05 DIAGNOSIS — K269 Duodenal ulcer, unspecified as acute or chronic, without hemorrhage or perforation: Secondary | ICD-10-CM | POA: Diagnosis not present

## 2015-07-05 NOTE — Progress Notes (Signed)
[place]  Telephone:(336) [phone] Fax:(336) [phone]  Clinic New Consult Note   Patient Care Team: L.Donnie Coffin, MD as PCP - General (Family Medicine) Arta Silence, MD as Consulting Physician (Gastroenterology) Stark Klein, MD as Consulting Physician (General Surgery) Truitt Merle, MD as Consulting Physician (Hematology) 07/05/2015  CHIEF COMPLAINTS/PURPOSE OF CONSULTATION:  Abnormal CT scan   HISTORY OF PRESENTING ILLNESS:  Johnny Barker 44 y.o. male is here because of a recent abnormal CT scan, which showed a left upper quadrant abdominal mass.  He presented with sudden onset severe epigastric pain with radiation to his left abdomen started at 4:00 on 05/26/2015. It subsided after a few hours, and then he went to his work. Due to the morning he had a few episodes of diaphoresis, persistent abdominal pain, so he went to urgent care and was subsequently transferred to [facility] emergency department. He underwent a CT of the abdomen, which showed a large soft tissue mass in the left upper quadrant of his abdomen. The rest of workup was negative. Patient did not want to be admitted, and was referred to gastroenterologist Dr. Paulita Fujita, who saw him the next day. He underwent upper endoscopy, which showed multiple duodenum ulcers, and biopsy was negative for malignant cells. He was started on Prilosec twice daily.  His abdominal pain has significantly improved, he is eating normally without issues. His bowel movement is normal. He denies any other symptoms, no nausea, no change of his bowel habits. no fever chills or night sweats. He intentionally lost about 20 pounds in the past 3 months, due to diet change for his hypertension.  Patient with past history of splenectomy at age of 49. He does not remember the exact reason for the surgery. He states he has some epistaxis, which resolved after splenectomy. He vaguely remember he might have some issue with his spleen (? Infarct or  injury), but not certain.   INTERIM HISTORY  Johnny Barker returns for follow-up and to discuss the PET scan findings. He is doing well overall, still has subtle intermittent epigastric discomfort, no nausea, bloating, or other complaints. He has good appetite and eats well. His bowel movement is normal, he denies any melena or hematochezia. No fever, chill, night sweats or recent weight loss.  MEDICAL HISTORY:  Past Medical History  Diagnosis Date  . Hypertension   . Kidney stones     SURGICAL HISTORY: Past Surgical History  Procedure Laterality Date  . Splenectomy, partial      Age 68  . Knee surgery      SOCIAL HISTORY: History   Social History  . Marital Status: Married    Spouse Name: N/A  . Number of Children: N/A  . Years of Education: N/A   Occupational History  . Not on file.   Social History Main Topics  . Smoking status: Never Smoker   . Smokeless tobacco: Not on file  . Alcohol Use: No  . Drug Use: No  . Sexual Activity: Not on file   Other Topics Concern  . Not on file   Social History Narrative   Married, wife [name]   Adult child, Chijioke Lasser   Occupation: Freight forwarder at [employer]: Family History  Problem Relation Age of Onset  . Hypertension Mother   . Multiple sclerosis Sister   . Hypertension Maternal Grandmother     ALLERGIES:  has No Known Allergies.  MEDICATIONS:  Current Outpatient Prescriptions  Medication Sig Dispense Refill  . fluocinonide  cream (LIDEX) 5.64 % Apply 1 application topically as needed. Hasn't filled yet    . omeprazole (PRILOSEC) 20 MG capsule Take 20 mg by mouth 2 (two) times daily before a meal.    . aspirin-acetaminophen-caffeine (EXCEDRIN MIGRAINE) 250-250-65 MG per tablet Take 2 tablets by mouth every 8 (eight) hours as needed for headache.    . cetirizine (ZYRTEC) 10 MG chewable tablet Chew 10 mg by mouth as needed for allergies.    Marland Kitchen ondansetron (ZOFRAN ODT) 4 MG disintegrating tablet Take 1  tablet (4 mg total) by mouth every 8 (eight) hours as needed for nausea or vomiting. (Patient not taking: Reported on 06/10/2015) 10 tablet 0  . oxyCODONE-acetaminophen (PERCOCET/ROXICET) 5-325 MG per tablet Take 1-2 tablets by mouth every 6 (six) hours as needed for severe pain. (Patient not taking: Reported on 06/10/2015) 20 tablet 0   No current facility-administered medications for this visit.    REVIEW OF SYSTEMS:   Constitutional: Denies fevers, chills or abnormal night sweats Eyes: Denies blurriness of vision, double vision or watery eyes Ears, nose, mouth, throat, and face: Denies mucositis or sore throat Respiratory: Denies cough, dyspnea or wheezes Cardiovascular: Denies palpitation, chest discomfort or lower extremity swelling Gastrointestinal:  Denies nausea, heartburn or change in bowel habits Skin: Denies abnormal skin rashes Lymphatics: Denies new lymphadenopathy or easy bruising Neurological:Denies numbness, tingling or new weaknesses Behavioral/Psych: Mood is stable, no new changes  All other systems were reviewed with the patient and are negative.  PHYSICAL EXAMINATION: ECOG PERFORMANCE STATUS: 0 - Asymptomatic BP 157/94 mmHg  Pulse 71  Temp(Src) 98.7 F (37.1 C) (Oral)  Resp 17  Ht 6\' 2"  (1.88 m)  Wt 206 lb 12.8 oz (93.804 kg)  BMI 26.54 kg/m2  SpO2 100%  GENERAL:alert, no distress and comfortable SKIN: skin color, texture, turgor are normal, no rashes or significant lesions EYES: normal, conjunctiva are pink and non-injected, sclera clear OROPHARYNX:no exudate, no erythema and lips, buccal mucosa, and tongue normal  NECK: supple, thyroid normal size, non-tender, without nodularity LYMPH:  no palpable lymphadenopathy in the cervical, axillary or inguinal LUNGS: clear to auscultation and percussion with normal breathing effort HEART: regular rate & rhythm and no murmurs and no lower extremity edema ABDOMEN:abdomen soft, non-tender and normal bowel  sounds Musculoskeletal:no cyanosis of digits and no clubbing  PSYCH: alert & oriented x 3 with fluent speech NEURO: no focal motor/sensory deficits  LABORATORY DATA:  I have reviewed the data as listed Lab Results  Component Value Date   WBC 11.6* 05/26/2015   HGB 13.9 05/26/2015   HCT 40.6 05/26/2015   MCV 79.6 05/26/2015   PLT 410* 05/26/2015    Recent Labs  05/26/15 1930 06/10/15 1207  NA 135  --   K 4.1  --   CL 103  --   CO2 24  --   GLUCOSE 110*  --   BUN 9  --   CREATININE 1.20  --   CALCIUM 8.8*  --   GFRNONAA >60  --   GFRAA >60  --   PROT 7.1  --   ALBUMIN 3.8  --   AST 18  --   ALT 14*  --   ALKPHOS 53  --   BILITOT 2.1* 0.8  BILIDIR  --  0.3  IBILI  --  0.5   Results for CHOYA, TORNOW (MRN [id number]) as of 07/04/2015 07:32  Ref. Range 06/10/2015 12:07  CA 19-9 Latest Ref Range: <35.0 U/mL 11.6  CEA Latest Ref  Range: 0.0-5.0 ng/mL <0.5   PATHOLOGY Duodenum, biopsy, ulcer  05/30/2015 -Peptic 2 tendinitis with erosions. -No dysplasia or malignancy identified.  RADIOGRAPHIC STUDIES: I have personally reviewed the radiological images as listed and agreed with the findings in the report.  Ct Abdomen Pelvis W Contrast 05/26/2015    IMPRESSION: 1. Focal circumferential wall thickening at the antrum of the stomach and pylorus, with surrounding soft tissue inflammation and prominent nodes, compatible with a primary gastric malignancy. 2. Large amount of irregular mass at the anterior aspect of left upper quadrant, layering along the left hemidiaphragm, measuring approximately 13.4 x 10.5 x 5.5 cm. This is compatible with peritoneal carcinomatosis. Underlying soft tissue edema and increased vascularity about the left upper quadrant. 3. Small amount of high density ascites noted tracking about the abdomen and pelvis, likely reflecting the peritoneal carcinomatosis. 4. Patchy bibasilar airspace opacities may reflect atelectasis or pneumonia. 5. Bilateral  nonobstructing renal stones noted.  These results were called by telephone at the time of interpretation on 05/26/2015 at 10:23 pm to [facility] PA, who verbally acknowledged these results.   Electronically Signed   By: Garald Balding M.D.   On: 05/26/2015 22:29   Upper GI endoscopy 05/30/2015 by Dr. Paulita Fujita Impression -Normal esophagus -Gastritis  -Multiple nonbleeding duodenal ulcers. Biopsied. Microscopically no findings to support malignancy. I wonder whether patient had self-contained duodenal ulcer perforation, which might account for his CT scan findings. -Erythematous due to adenopathy -The exam was otherwise normal  PET/CT 06/28/2015 IMPRESSION: 1. Significantly improved appearance of the abdomen. Near complete resolution of left upper quadrant soft tissue fullness with resolution of complex abdominal pelvic fluid (which was likely hemorrhage on the prior CT). Suspect that the left upper quadrant amorphous soft tissue density was secondary to hematoma (possibly from gastroepiploic venous bleed). Residual left upper quadrant soft tissue density is favored to represent resolving hematoma. No evidence of gastric or perigastric hypermetabolic primary malignancy. 2. Bilateral inguinal and external iliac borderline enlarged and mild to moderately hypermetabolic nodes. These are nonspecific. Given absence of primary malignancy in the abdomen, could be reactive. Alternate considerations, including lymphoma could look similar. Consider follow-up with CT at 2-3 months. Also consider clinical/laboratory exclusion of lymphoma. 3. Bilateral nephrolithiasis. 4. [place]ine tonsil hypermetabolism, favored to be physiologic.    ASSESSMENT & PLAN: 44 year old African-American male with past medical history of hypertension  1. Left upper quadrant abdominal mass, hematoma from duodenal ulcer perforation -I discussed the PET CT scan finding from July 19 with patient in great detail. Apparently  the left upper quadrant abdominal mass has nearly resolved, which is consistent with a resolved hematoma which was seen on the prior CT. -I think the bilateral inguinal and external iliac hypermetabolic lymph nodes are likely reactive, he does not have any clinical concern for lymphoma, I suggest him to obtain a repeat CT scan in 4 months for follow up. If the repeat a CT scan is negative, he does not need to follow up with me afterwards. -His tumor markers CEA and a CA 19.9 was negative.  2. Slightly elevated bilirubin  -He is asymptomatic, normal liver enzymes  -This is resolved on repeat lab.  3. Duodenal ulcers  -His episodes of abdominal pain is likely related to self-contained duodenal ulcer perforation  -Continue PPI, follow-up with Dr. Paulita Fujita  Plan -RTC with a repeat a CT abdomen and pelvis in 4 months   All questions were answered. The patient knows to call the clinic with any problems, questions or concerns.  I spent 20 minutes counseling the patient face to face. The total time spent in the appointment was 25 minutes and more than 50% was on counseling.     Truitt Merle, MD 07/05/2015 11:57 AM

## 2015-07-05 NOTE — [facility] Oncology Navigator Note (Signed)
Oncology Nurse Navigator Documentation  Oncology Nurse Navigator Flowsheets 07/05/2015  Referral date to RadOnc/MedOnc -  Navigator Encounter Type F/U scan results  Patient Visit Type Medonc  Barriers/Navigation Needs No barriers at this time  Interventions None required  Time Spent with Patient 5  Pleased with PET and lab results. Understands need to have f/u CT scan in few months to confirm it is still resolving.

## 2015-07-05 NOTE — Telephone Encounter (Signed)
Gave and printed appt sched and avs for pt for OCT/.///gv barium °

## 2015-07-22 ENCOUNTER — Telehealth: Payer: Self-pay | Admitting: *Deleted

## 2015-07-22 NOTE — Telephone Encounter (Signed)
Yes, sounds good, thanks.  Johnny Barker

## 2015-07-22 NOTE — Telephone Encounter (Signed)
TC from [facility] in Radiology scheduling regarding CT scan scheduling for 4 month repeat scan. $ months from last scan and MD appt would be in novemeber. Radiology scheduling this appt for 10/25/15. If pt needs to see Dr. Burr Medico it would then need to be after that. Pt wants only Tuesday appts as it is his day off.  Ok for labs on 10/25/15 8am, CT scan @ 9 am  And Dr. Burr Medico (if needed) on 11/01/15?

## 2015-07-25 ENCOUNTER — Telehealth: Payer: Self-pay | Admitting: Hematology

## 2015-07-25 ENCOUNTER — Other Ambulatory Visit: Payer: Self-pay | Admitting: *Deleted

## 2015-07-25 NOTE — Telephone Encounter (Signed)
per pof to r/s pt appt-cld & left pt a message to adv of r/s time & date

## 2015-09-27 ENCOUNTER — Other Ambulatory Visit: Payer: BLUE CROSS/BLUE SHIELD

## 2015-10-04 ENCOUNTER — Ambulatory Visit: Payer: BLUE CROSS/BLUE SHIELD | Admitting: Hematology

## 2015-10-25 ENCOUNTER — Other Ambulatory Visit (HOSPITAL_BASED_OUTPATIENT_CLINIC_OR_DEPARTMENT_OTHER): Payer: BLUE CROSS/BLUE SHIELD

## 2015-10-25 ENCOUNTER — Ambulatory Visit (HOSPITAL_COMMUNITY)
Admission: RE | Admit: 2015-10-25 | Discharge: 2015-10-25 | Disposition: A | Discharge status: Home / Self Care | Payer: BLUE CROSS/BLUE SHIELD | Source: Ambulatory Visit | Attending: Hematology | Admitting: Hematology

## 2015-10-25 ENCOUNTER — Encounter (HOSPITAL_COMMUNITY): Payer: Self-pay

## 2015-10-25 DIAGNOSIS — R590 Localized enlarged lymph nodes: Secondary | ICD-10-CM | POA: Diagnosis present

## 2015-10-25 DIAGNOSIS — Z09 Encounter for follow-up examination after completed treatment for conditions other than malignant neoplasm: Secondary | ICD-10-CM | POA: Diagnosis not present

## 2015-10-25 DIAGNOSIS — R1902 Left upper quadrant abdominal swelling, mass and lump: Secondary | ICD-10-CM | POA: Insufficient documentation

## 2015-10-25 DIAGNOSIS — N200 Calculus of kidney: Secondary | ICD-10-CM | POA: Insufficient documentation

## 2015-10-25 LAB — CBC WITH DIFFERENTIAL/PLATELET
BASO%: 0.5 % (ref 0.0–2.0)
Basophils Absolute: 0 10*3/uL (ref 0.0–0.1)
EOS%: 2.6 % (ref 0.0–7.0)
Eosinophils Absolute: 0.2 10*3/uL (ref 0.0–0.5)
HEMATOCRIT: 44.7 % (ref 38.4–49.9)
HGB: 15.3 g/dL (ref 13.0–17.1)
LYMPH#: 2.9 10*3/uL (ref 0.9–3.3)
LYMPH%: 49.7 % — AB (ref 14.0–49.0)
MCH: 27.1 pg — ABNORMAL LOW (ref 27.2–33.4)
MCHC: 34.2 g/dL (ref 32.0–36.0)
MCV: 79.1 fL — ABNORMAL LOW (ref 79.3–98.0)
MONO#: 0.5 10*3/uL (ref 0.1–0.9)
MONO%: 9 % (ref 0.0–14.0)
NEUT#: 2.2 10*3/uL (ref 1.5–6.5)
NEUT%: 38.2 % — AB (ref 39.0–75.0)
Platelets: 371 10*3/uL (ref 140–400)
RBC: 5.65 10*6/uL (ref 4.20–5.82)
RDW: 15 % — ABNORMAL HIGH (ref 11.0–14.6)
WBC: 5.8 10*3/uL (ref 4.0–10.3)

## 2015-10-25 LAB — COMPREHENSIVE METABOLIC PANEL (CC13)
ALT: 19 U/L (ref 0–55)
AST: 22 U/L (ref 5–34)
Albumin: 4 g/dL (ref 3.5–5.0)
Alkaline Phosphatase: 61 U/L (ref 40–150)
Anion Gap: 7 mEq/L (ref 3–11)
BUN: 10.9 mg/dL (ref 7.0–26.0)
CHLORIDE: 108 meq/L (ref 98–109)
CO2: 26 meq/L (ref 22–29)
CREATININE: 1.1 mg/dL (ref 0.7–1.3)
Calcium: 9.1 mg/dL (ref 8.4–10.4)
EGFR: >90 mL/min/{1.73_m2} (ref >90)
Glucose: 95 mg/dl (ref 70–140)
Potassium: 4 mEq/L (ref 3.5–5.1)
Sodium: 142 mEq/L (ref 136–145)
Total Bilirubin: 1.53 mg/dL — ABNORMAL HIGH (ref 0.20–1.20)
Total Protein: 7.5 g/dL (ref 6.4–8.3)

## 2015-10-25 LAB — LACTATE DEHYDROGENASE (CC13): LDH: 179 U/L (ref 125–245)

## 2015-10-25 MED ORDER — IOHEXOL 300 MG/ML  SOLN
100.0000 mL | Freq: Once | INTRAMUSCULAR | Status: AC | PRN
  Administered 2015-10-25: 100 mL via INTRAVENOUS

## 2015-10-25 MED ORDER — IOHEXOL 300 MG/ML  SOLN
25.0000 mL | INTRAMUSCULAR | Status: AC
  Administered 2015-10-25: 25 mL via ORAL

## 2015-11-01 ENCOUNTER — Encounter: Payer: Self-pay | Admitting: Hematology

## 2015-11-01 ENCOUNTER — Encounter: Payer: BLUE CROSS/BLUE SHIELD | Admitting: Hematology

## 2015-11-01 NOTE — Progress Notes (Signed)
This encounter was created in error - please disregard.

## 2015-11-02 ENCOUNTER — Telehealth: Payer: Self-pay | Admitting: Hematology

## 2015-11-02 ENCOUNTER — Other Ambulatory Visit: Payer: Self-pay | Admitting: *Deleted

## 2015-11-02 NOTE — Telephone Encounter (Signed)
per pof to sch pt appt-cld & spoke to pt and adv pt of r/s time & date

## 2015-11-09 ENCOUNTER — Encounter: Payer: Self-pay | Admitting: Hematology

## 2015-11-09 ENCOUNTER — Ambulatory Visit (HOSPITAL_BASED_OUTPATIENT_CLINIC_OR_DEPARTMENT_OTHER): Payer: BLUE CROSS/BLUE SHIELD | Admitting: Hematology

## 2015-11-09 VITALS — BP 164/97 | HR 75 | Temp 98.9°F | Resp 18 | Ht 74.0 in | Wt 208.0 lb

## 2015-11-09 DIAGNOSIS — K261 Acute duodenal ulcer with perforation: Secondary | ICD-10-CM | POA: Diagnosis not present

## 2015-11-09 DIAGNOSIS — S3692XD Contusion of unspecified intra-abdominal organ, subsequent encounter: Secondary | ICD-10-CM

## 2015-11-09 DIAGNOSIS — R590 Localized enlarged lymph nodes: Secondary | ICD-10-CM

## 2015-11-09 DIAGNOSIS — R1902 Left upper quadrant abdominal swelling, mass and lump: Secondary | ICD-10-CM | POA: Diagnosis not present

## 2015-11-09 DIAGNOSIS — IMO0001 Reserved for inherently not codable concepts without codable children: Secondary | ICD-10-CM

## 2015-11-09 DIAGNOSIS — Z9081 Acquired absence of spleen: Secondary | ICD-10-CM

## 2015-11-09 DIAGNOSIS — R591 Generalized enlarged lymph nodes: Secondary | ICD-10-CM | POA: Insufficient documentation

## 2015-11-09 DIAGNOSIS — R599 Enlarged lymph nodes, unspecified: Secondary | ICD-10-CM | POA: Insufficient documentation

## 2015-11-09 NOTE — Progress Notes (Signed)
[place]  [phone] [phone]  Clinic follow Up Note   Patient Care Team: L.Donnie Coffin, MD as PCP - General (Family Medicine) Arta Silence, MD as Consulting Physician (Gastroenterology) Stark Klein, MD as Consulting Physician (General Surgery) Truitt Merle, MD as Consulting Physician (Hematology) 11/09/2015  CHIEF COMPLAINTS:  Follow up CT scan   HISTORY OF PRESENTING ILLNESS:  Johnny Barker 44 y.o. male is here because of a recent abnormal CT scan, which showed a left upper quadrant abdominal mass.  He presented with sudden onset severe epigastric pain with radiation to his left abdomen started at 4:00 on 05/26/2015. It subsided after a few hours, and then he went to his work. Due to the morning he had a few episodes of diaphoresis, persistent abdominal pain, so he went to urgent care and was subsequently transferred to [facility] emergency department. He underwent a CT of the abdomen, which showed a large soft tissue mass in the left upper quadrant of his abdomen. The rest of workup was negative. Patient did not want to be admitted, and was referred to gastroenterologist Dr. Paulita Fujita, who saw him the next day. He underwent upper endoscopy, which showed multiple duodenum ulcers, and biopsy was negative for malignant cells. He was started on Prilosec twice daily.  His abdominal pain has significantly improved, he is eating normally without issues. His bowel movement is normal. He denies any other symptoms, no nausea, no change of his bowel habits. no fever chills or night sweats. He intentionally lost about 20 pounds in the past 3 months, due to diet change for his hypertension.  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 He does not remember the exact reason for the surgery. He states he has some epistaxis, which resolved after splenectomy. He vaguely remember he might have some issue with his spleen (? Infarct or injury), but not  certain.   INTERIM HISTORY  Johnny Barker returns for follow-up and to discuss the repeated CT scan result. He has been doing well, denies any abdominal pain, nausea, change of his bowel habit or other complaints. He has good appetite and energy level, weight is stable. No fever or chills or night sweats.  MEDICAL HISTORY:  Past Medical History  Diagnosis Date  . Hypertension   . Kidney stones     SURGICAL HISTORY: Past Surgical History  Procedure Laterality Date  . Splenectomy, partial      Age 45  . Knee surgery      SOCIAL HISTORY: Social History   Social History  . Marital Status: Married    Spouse Name: N/A  . Number of Children: N/A  . Years of Education: N/A   Occupational History  . Not on file.   Social History Main Topics  . Smoking status: Never Smoker   . Smokeless tobacco: Not on file  . Alcohol Use: No  . Drug Use: No  . Sexual Activity: Not on file   Other Topics Concern  . Not on file   Social History Narrative   Married, wife Johnny Barker   Adult child, Johnny Barker   Occupation: Freight forwarder at [employer]: Family History  Problem Relation Age of Onset  . Hypertension Mother   . Multiple sclerosis Sister   . Hypertension Maternal Grandmother     ALLERGIES:  has No Known Allergies.  MEDICATIONS:  Current Outpatient Prescriptions  Medication Sig Dispense Refill  . aspirin-acetaminophen-caffeine (EXCEDRIN MIGRAINE) 250-250-65 MG per tablet Take 2  tablets by mouth every 8 (eight) hours as needed for headache.    . cetirizine (ZYRTEC) 10 MG chewable tablet Chew 10 mg by mouth as needed for allergies.     No current facility-administered medications for this visit.    REVIEW OF SYSTEMS:   Constitutional: Denies fevers, chills or abnormal night sweats Eyes: Denies blurriness of vision, double vision or watery eyes Ears, nose, mouth, throat, and face: Denies mucositis or sore throat Respiratory: Denies cough, dyspnea or  wheezes Cardiovascular: Denies palpitation, chest discomfort or lower extremity swelling Gastrointestinal:  Denies nausea, heartburn or change in bowel habits Skin: Denies abnormal skin rashes Lymphatics: Denies new lymphadenopathy or easy bruising Neurological:Denies numbness, tingling or new weaknesses Behavioral/Psych: Mood is stable, no new changes  All other systems were reviewed with the patient and are negative.  PHYSICAL EXAMINATION: ECOG PERFORMANCE STATUS: 0 - Asymptomatic BP 164/97 mmHg  Pulse 75  Temp(Src) 98.9 F (37.2 C) (Oral)  Resp 18  Ht 6\' 2"  (1.88 m)  Wt 208 lb (94.348 kg)  BMI 26.69 kg/m2  SpO2 100%  GENERAL:alert, no distress and comfortable SKIN: skin color, texture, turgor are normal, no rashes or significant lesions EYES: normal, conjunctiva are pink and non-injected, sclera clear OROPHARYNX:no exudate, no erythema and lips, buccal mucosa, and tongue normal  NECK: supple, thyroid normal size, non-tender, without nodularity LYMPH:  no palpable lymphadenopathy in the cervical, axillary or inguinal LUNGS: clear to auscultation and percussion with normal breathing effort HEART: regular rate & rhythm and no murmurs and no lower extremity edema ABDOMEN:abdomen soft, non-tender and normal bowel sounds Musculoskeletal:no cyanosis of digits and no clubbing  PSYCH: alert & oriented x 3 with fluent speech NEURO: no focal motor/sensory deficits  LABORATORY DATA:  I have reviewed the data as listed Lab Results  Component Value Date   WBC 5.8 10/25/2015   HGB 15.3 10/25/2015   HCT 44.7 10/25/2015   MCV 79.1* 10/25/2015   PLT 371 10/25/2015    Recent Labs  05/26/15 1930 06/10/15 1207 10/25/15 0845  NA 135  --  142  K 4.1  --  4.0  CL 103  --   --   CO2 24  --  26  GLUCOSE 110*  --  95  BUN 9  --  10.9  CREATININE 1.20  --  1.1  CALCIUM 8.8*  --  9.1  GFRNONAA >60  --   --   GFRAA >60  --   --   PROT 7.1  --  7.5  ALBUMIN 3.8  --  4.0  AST 18  --   22  ALT 14*  --  19  ALKPHOS 53  --  61  BILITOT 2.1* 0.8 1.53*  BILIDIR  --  0.3  --   IBILI  --  0.5  --    Results for Johnny Barker (MRN [id number]) as of 07/04/2015 07:32  Ref. Range 06/10/2015 12:07  CA 19-9 Latest Ref Range: <35.0 U/mL 11.6  CEA Latest Ref Range: 0.0-5.0 ng/mL <0.5   PATHOLOGY Duodenum, biopsy, ulcer  05/30/2015 -Peptic 2 tendinitis with erosions. -No dysplasia or malignancy identified.  RADIOGRAPHIC STUDIES: I have personally reviewed the radiological images as listed and agreed with the findings in the report.  Ct Abdomen Pelvis W Contrast 05/26/2015    IMPRESSION: 1. Focal circumferential wall thickening at the antrum of the stomach and pylorus, with surrounding soft tissue inflammation and prominent nodes, compatible with a primary gastric malignancy. 2. Large amount of irregular mass  at the anterior aspect of left upper quadrant, layering along the left hemidiaphragm, measuring approximately 13.4 x 10.5 x 5.5 cm. This is compatible with peritoneal carcinomatosis. Underlying soft tissue edema and increased vascularity about the left upper quadrant. 3. Small amount of high density ascites noted tracking about the abdomen and pelvis, likely reflecting the peritoneal carcinomatosis. 4. Patchy bibasilar airspace opacities may reflect atelectasis or pneumonia. 5. Bilateral nonobstructing renal stones noted.  These results were called by telephone at the time of interpretation on 05/26/2015 at 10:23 pm to [facility] PA, who verbally acknowledged these results.   Electronically Signed   By: Garald Balding M.D.   On: 05/26/2015 22:29   Upper GI endoscopy 05/30/2015 by Dr. Paulita Fujita Impression -Normal esophagus -Gastritis  -Multiple nonbleeding duodenal ulcers. Biopsied. Microscopically no findings to support malignancy. I wonder whether patient had self-contained duodenal ulcer perforation, which might account for his CT scan findings. -Erythematous due to adenopathy -The  exam was otherwise normal  PET/CT 06/28/2015 IMPRESSION: 1. Significantly improved appearance of the abdomen. Near complete resolution of left upper quadrant soft tissue fullness with resolution of complex abdominal pelvic fluid (which was likely hemorrhage on the prior CT). Suspect that the left upper quadrant amorphous soft tissue density was secondary to hematoma (possibly from gastroepiploic venous bleed). Residual left upper quadrant soft tissue density is favored to represent resolving hematoma. No evidence of gastric or perigastric hypermetabolic primary malignancy. 2. Bilateral inguinal and external iliac borderline enlarged and mild to moderately hypermetabolic nodes. These are nonspecific. Given absence of primary malignancy in the abdomen, could be reactive. Alternate considerations, including lymphoma could look similar. Consider follow-up with CT at 2-3 months. Also consider clinical/laboratory exclusion of lymphoma. 3. Bilateral nephrolithiasis. 4. Palatine tonsil hypermetabolism, favored to be physiologic.  CT abdomen and pelvis  10/25/2015  IMPRESSION:  1. Similar to improved pelvic adenopathy, consistent with a benign/reactive etiology. 2. Further improvement in appearance of the left upper quadrant. Near complete to complete resolution of previously described hematoma. 3. Bilateral nephrolithiasis.    ASSESSMENT & PLAN: 44 year old African-American male with past medical history of hypertension  1. Abdominal adenopathy, likely reactive -I reviewed his recent CT abdomen and pelvis scan result, his previously pelvic adenopathy has improved, likely benign overactive. -He is asymptomatic, no palpable peripheral adenopathy, LDH is normal, no clinical suspicion for lymphoma. -I do not think he needs follow-up CT or other workup, will observe clinically    2. Left upper quadrant abdominal mass, hematoma from duodenal ulcer perforation -This has resolved on the  recent repeat a CT scan.  3. Slightly elevated bilirubin  -He is asymptomatic, normal liver enzymes, likely benign  -I recommend him to follow-up with his primary care physician  4. Duodenal ulcers  -His episodes of abdominal pain is likely related to self-contained duodenal ulcer perforation  -Continue PPI, follow-up with Dr. Paulita Fujita  5. Bilateral nephrolithiasis -He had episode of kidney stones in the past -I reviewed the CT scan finding, and encourage him to keep well hydration  Plan -I'll only see him as needed in the future -He'll follow-up with his primary care physician  All questions were answered. The patient knows to call the clinic with any problems, questions or concerns. I spent 20 minutes counseling the patient face to face. The total time spent in the appointment was 25 minutes and more than 50% was on counseling.     Truitt Merle, MD 11/09/2015 9:27 PM

## 2016-06-29 IMAGING — CT CT ABD-PELV W/ CM
2 of 5 series · 15 of 46 positions shown, 17 images · IV contrast (APPLIED)
Comparison: Abdominal radiograph performed earlier today at [DATE]
p.m.

CLINICAL DATA: Acute onset of epigastric abdominal pain. Initial
encounter.

EXAM:
CT ABDOMEN AND PELVIS WITH CONTRAST
TECHNIQUE: Multidetector CT imaging of the abdomen and pelvis was performed
using the standard protocol following bolus administration of
intravenous contrast.
CONTRAST:  100mL OMNIPAQUE IOHEXOL 300 MG/ML  SOLN

[Series 2: abd/ pelvis 5.0 i30f 1 · axial · 0.71mm/px · z∈[+895,+1365]mm · 12 of 106 slices shown, 14 images]
[im 6/106  soft-tissue]
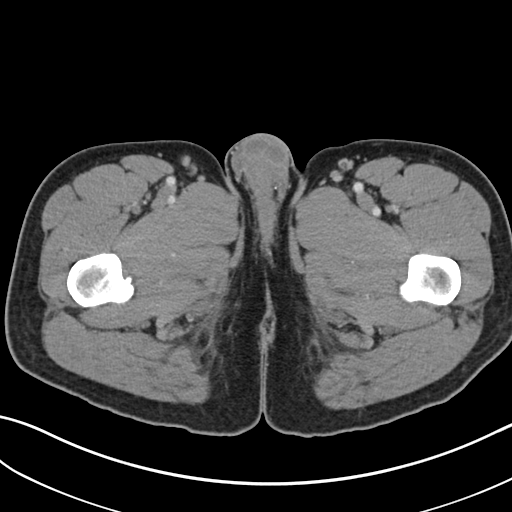
[im 6/106  bone]
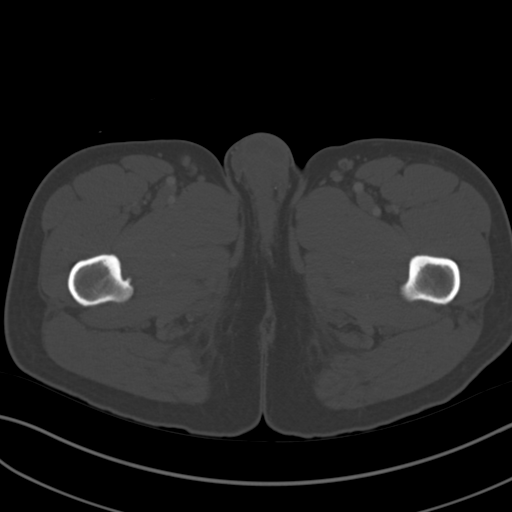
[im 17/106  soft-tissue]
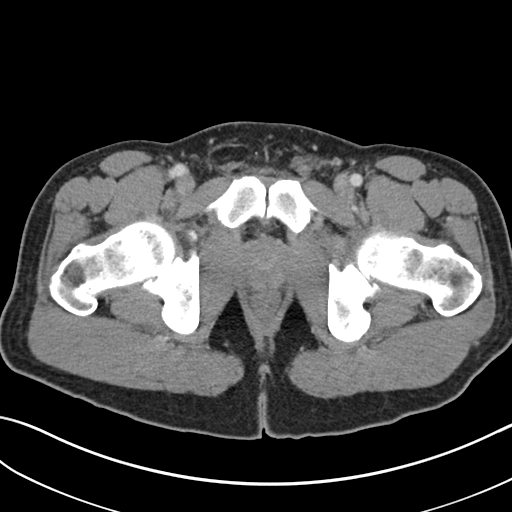
[im 23/106  soft-tissue]
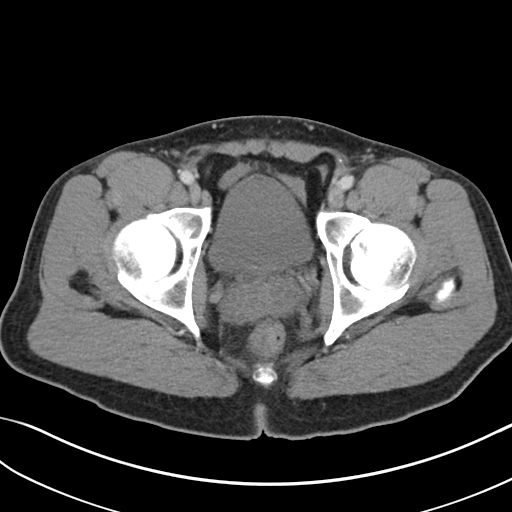
[im 34/106  soft-tissue]
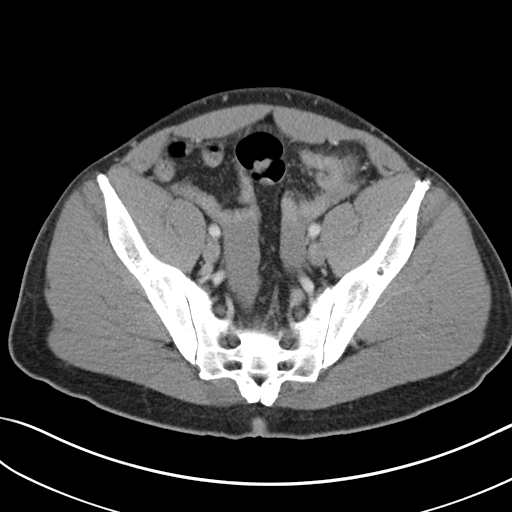
[im 39/106  soft-tissue]
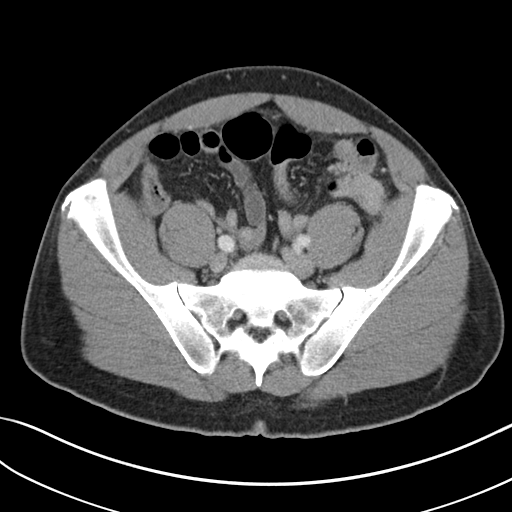
[im 50/106  soft-tissue]
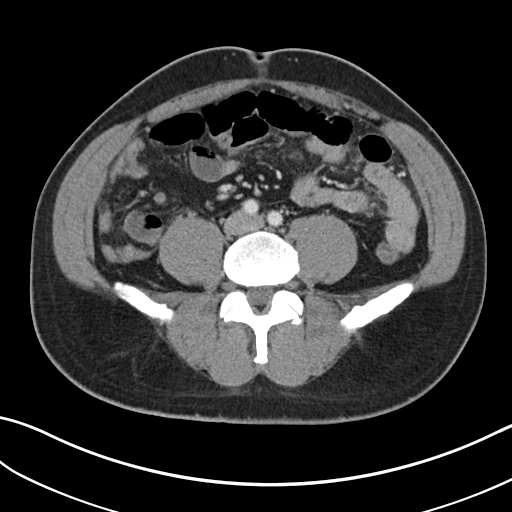
[im 56/106  soft-tissue]
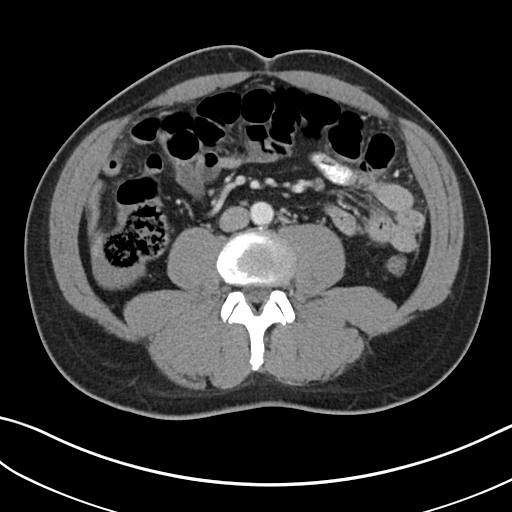
[im 67/106  soft-tissue]
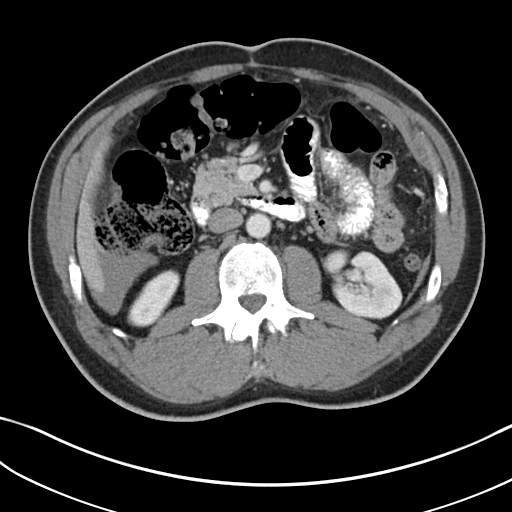
[im 72/106  soft-tissue]
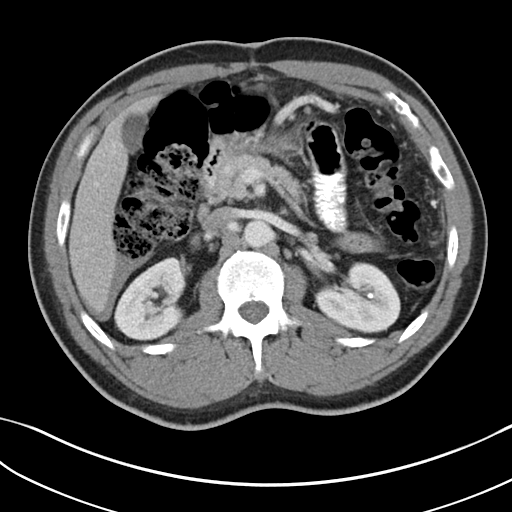
[im 72/106  bone]
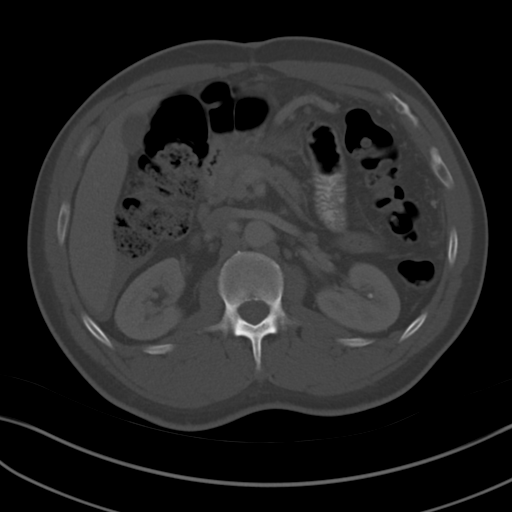
[im 83/106  soft-tissue]
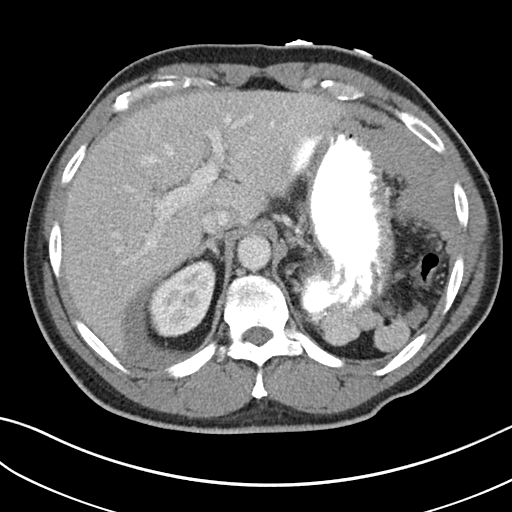
[im 89/106  soft-tissue]
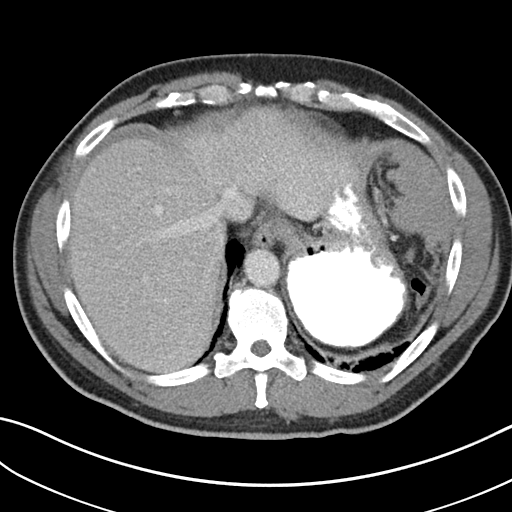
[im 100/106  soft-tissue]
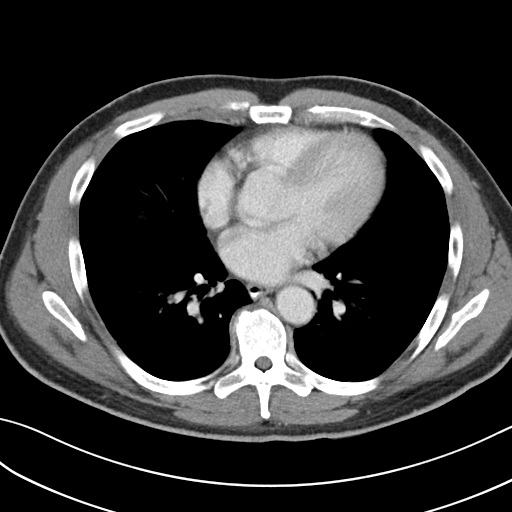

[Series 5: coronal soft tissue · coronal · 0.79mm/px · 3 of 89 slices shown]
[im 30/89  soft-tissue]
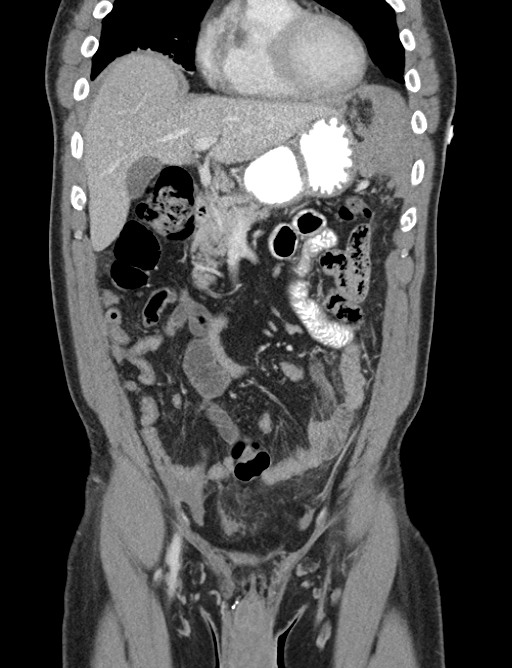
[im 40/89  soft-tissue]
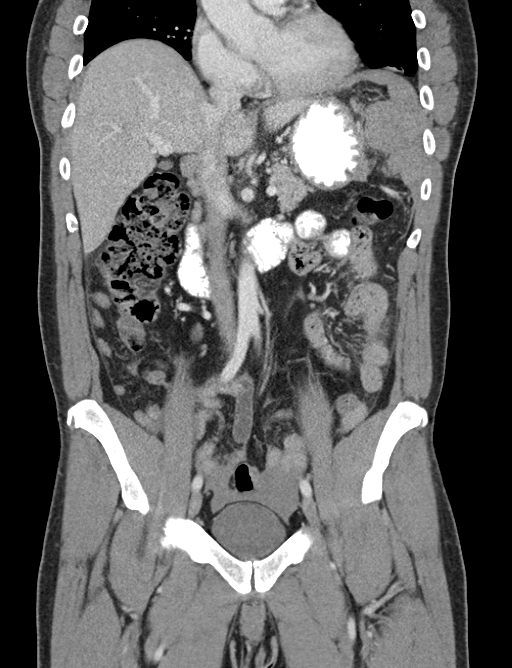
[im 49/89  soft-tissue]
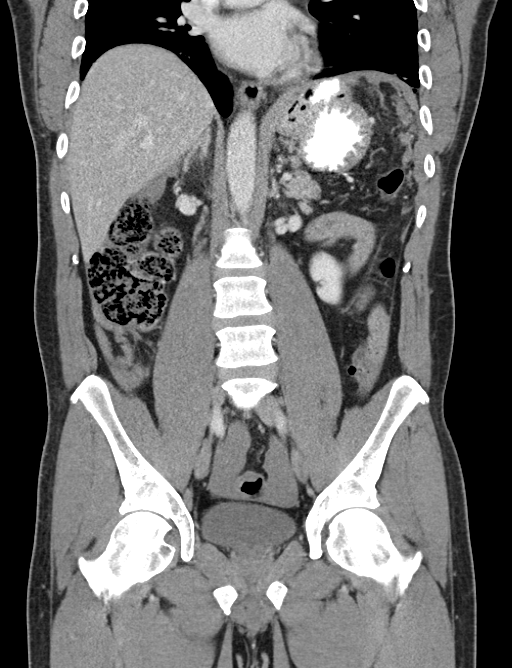

[15 of 46 positions shown; findings below may reference images not displayed]

FINDINGS: Patchy bibasilar airspace opacities may reflect atelectasis or
pneumonia.

There is focal circumferential wall thickening at the antrum of the
stomach and pylorus, with surrounding soft tissue inflammation and
prominent nodes, most compatible with a primary gastric malignancy.
The nodes measure up to 9 mm in short axis.

A large amount of irregular mass is noted at the anterior aspect of
the left upper quadrant, layering along the left hemidiaphragm,
measuring approximately 13.4 x 10.5 x 5.5 cm. This is compatible
with peritoneal carcinomatosis. Mild underlying soft tissue edema
and increased vascularity are seen about the left upper quadrant.

A small amount of high density ascites is noted tracking about the
abdomen and pelvis, likely reflecting the peritoneal carcinomatosis.
No definite additional peritoneal implants are seen.

The liver is unremarkable in appearance. The patient is status post
splenectomy. Several splenules are noted posteriorly at the left
upper quadrant. The gallbladder is unremarkable in appearance. The
pancreas is grossly unremarkable, though it abuts the gastric mass.
The adrenal glands are within normal limits.

A 1.0 cm stone is noted near the upper pole of the right kidney. A 5
mm stone is noted at the interpole region of the left kidney. There
is no evidence of hydronephrosis. No obstructing ureteral stones are
seen. No perinephric stranding is appreciated.

The small bowel is unremarkable in appearance. No acute vascular
abnormalities are seen.

The appendix is grossly unremarkable in appearance, though difficult
to fully characterize. There is no evidence for appendicitis. The
colon is unremarkable in appearance.

The bladder is mildly distended and grossly unremarkable in
appearance. The prostate remains normal in size. No inguinal
lymphadenopathy is seen.

No acute osseous abnormalities are identified.
IMPRESSION: 1. Focal circumferential wall thickening at the antrum of the
stomach and pylorus, with surrounding soft tissue inflammation and
prominent nodes, compatible with a primary gastric malignancy.
2. Large amount of irregular mass at the anterior aspect of left
upper quadrant, layering along the left hemidiaphragm, measuring
approximately 13.4 x 10.5 x 5.5 cm. This is compatible with
peritoneal carcinomatosis. Underlying soft tissue edema and
increased vascularity about the left upper quadrant.
3. Small amount of high density ascites noted tracking about the
abdomen and pelvis, likely reflecting the peritoneal carcinomatosis.
4. Patchy bibasilar airspace opacities may reflect atelectasis or
pneumonia.
5. Bilateral nonobstructing renal stones noted.

These results were called by telephone at the time of interpretation
on 05/26/2015 at [DATE] to ASHTIN RUGGIERO PA, who verbally
acknowledged these results.

## 2016-06-29 IMAGING — DX DG ABDOMEN ACUTE W/ 1V CHEST
4 series · 4 of 4 positions shown · non-contrast
Comparison: None.

CLINICAL DATA: Cold sweats and epigastric pain

EXAM:
DG ABDOMEN ACUTE W/ 1V CHEST

[chest pa]
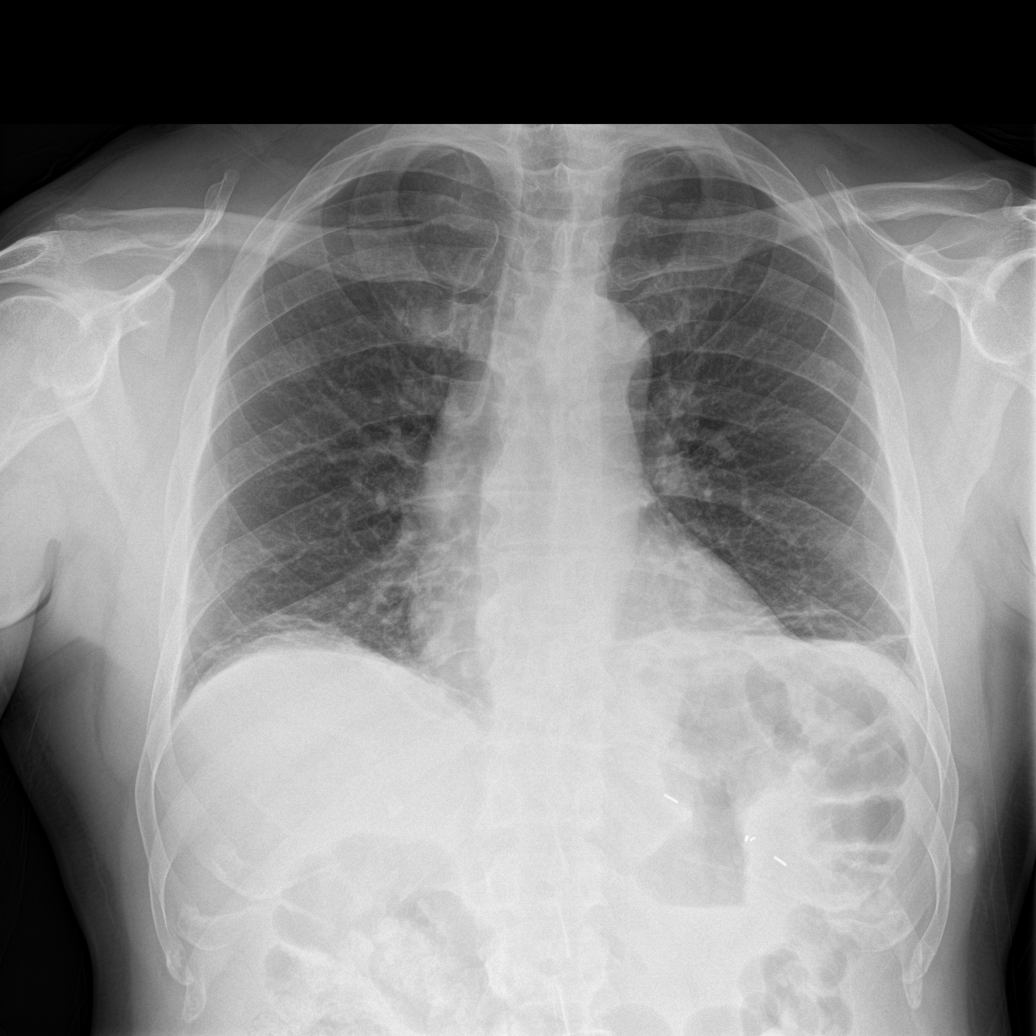

[abdomen erect]
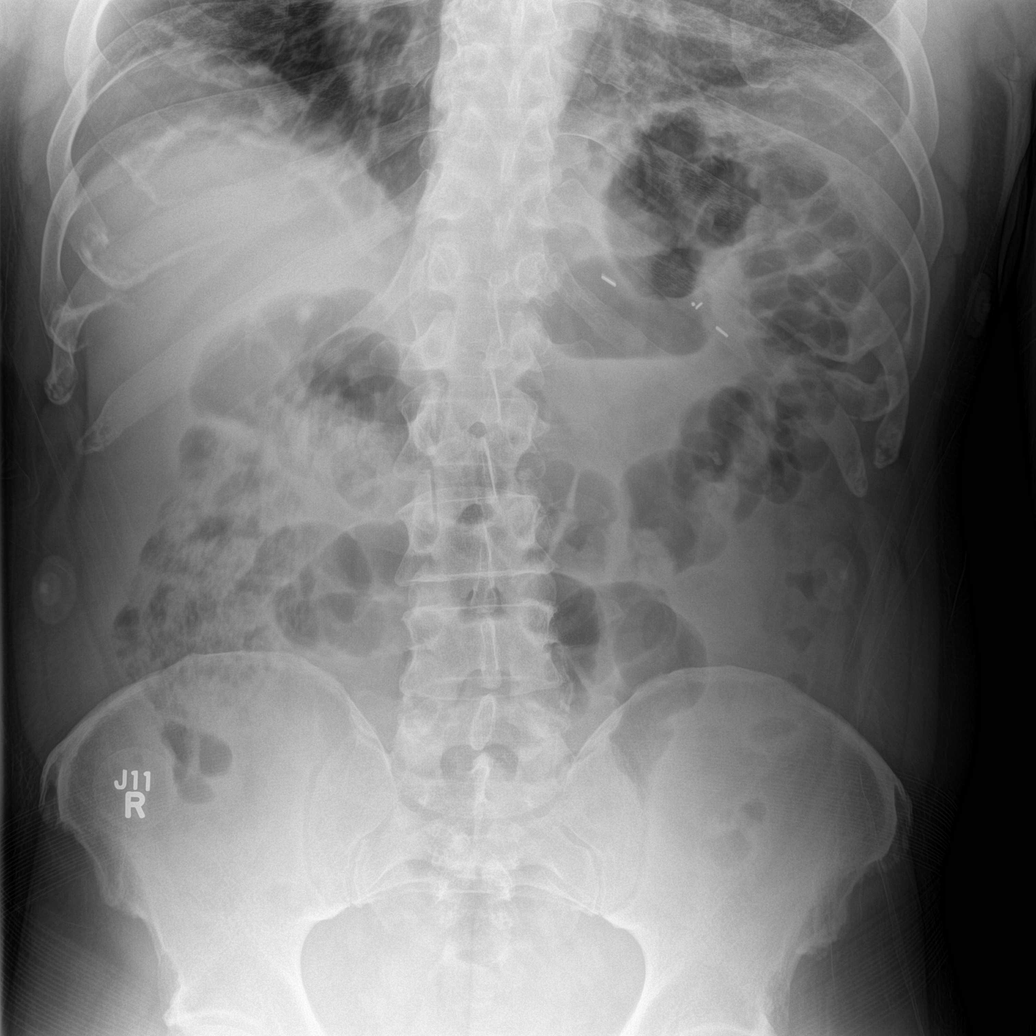

[abdomen supine (1 of 2)]
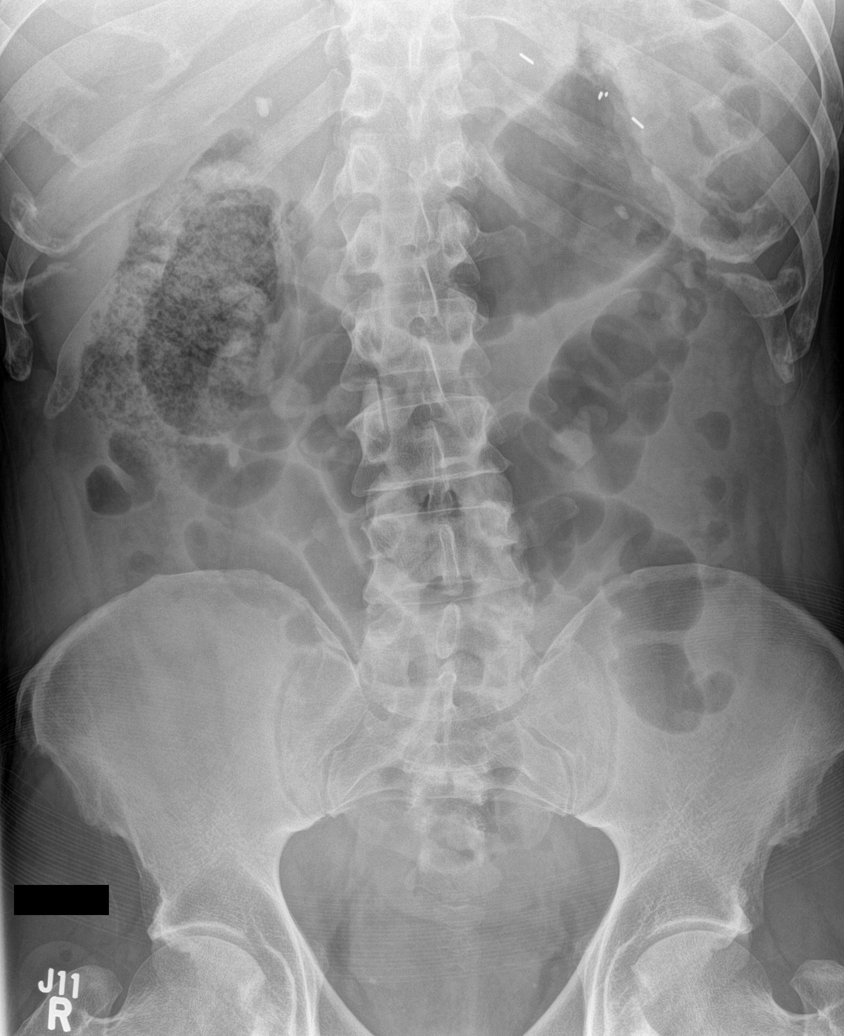

[abdomen supine (2 of 2)]
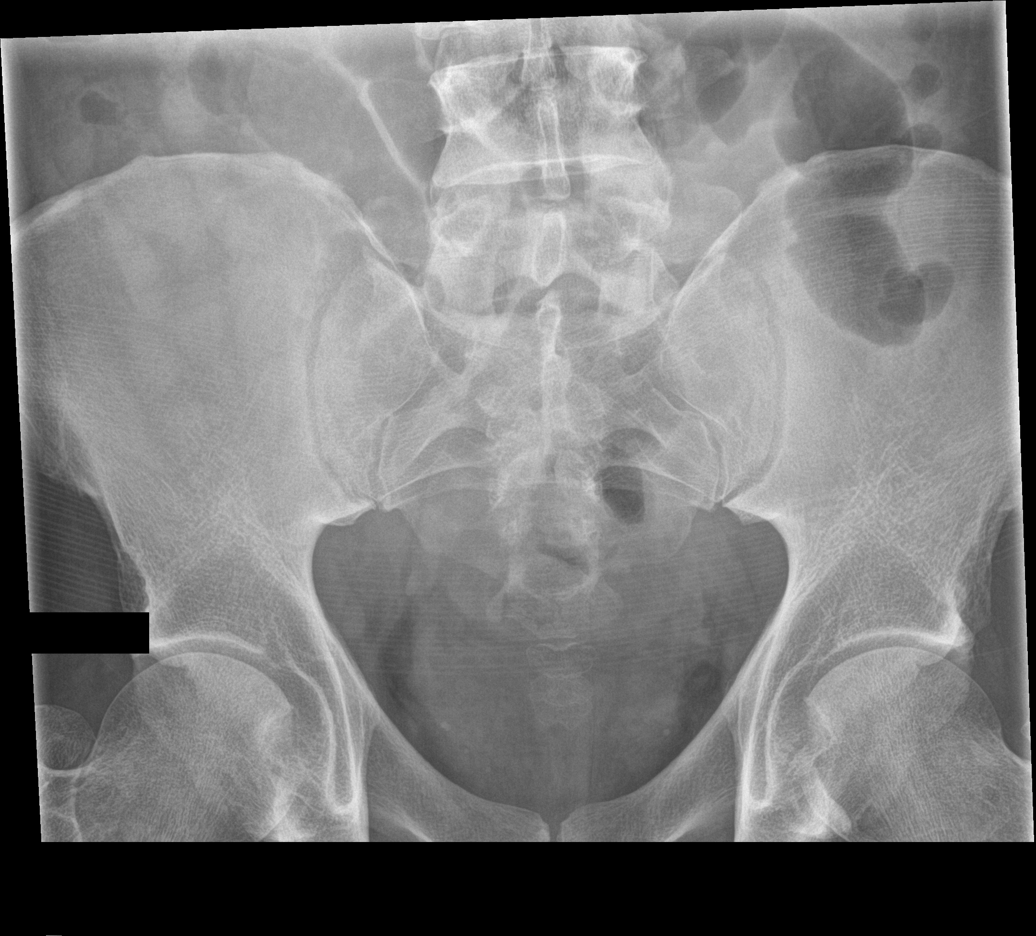

[4 of 4 positions shown; findings below may reference images not displayed]

FINDINGS: Cardiac shadow is within normal limits. Bibasilar atelectatic
changes are seen. No focal confluent infiltrate is noted.

Scattered large and small bowel gas is noted. No obstructive changes
are noted. There are calcifications seen bilaterally. The
calcifications on the left measure approximately 7 mm in greatest
dimension and likely represent left renal calculi. The calcification
on the right measures 10 mm in greatest dimension and may also
represent a renal calculus no bony abnormality is seen.
IMPRESSION: Nonspecific chest and abdomen. Bibasilar atelectatic changes are
seen as well as changes consistent with bilateral renal calculi.

## 2017-11-06 ENCOUNTER — Other Ambulatory Visit: Payer: Self-pay | Admitting: Family Medicine

## 2017-11-06 DIAGNOSIS — K3189 Other diseases of stomach and duodenum: Secondary | ICD-10-CM

## 2017-11-06 DIAGNOSIS — K319 Disease of stomach and duodenum, unspecified: Secondary | ICD-10-CM | POA: Diagnosis not present

## 2017-11-06 DIAGNOSIS — M542 Cervicalgia: Secondary | ICD-10-CM | POA: Diagnosis not present

## 2017-11-06 DIAGNOSIS — I10 Essential (primary) hypertension: Secondary | ICD-10-CM | POA: Diagnosis not present

## 2017-11-06 DIAGNOSIS — Z23 Encounter for immunization: Secondary | ICD-10-CM | POA: Diagnosis not present

## 2017-11-12 ENCOUNTER — Ambulatory Visit
Admission: RE | Admit: 2017-11-12 | Discharge: 2017-11-12 | Disposition: A | Discharge status: Home / Self Care | Payer: BLUE CROSS/BLUE SHIELD | Source: Ambulatory Visit | Attending: Family Medicine | Admitting: Family Medicine

## 2017-11-12 DIAGNOSIS — N201 Calculus of ureter: Secondary | ICD-10-CM | POA: Diagnosis not present

## 2017-11-12 DIAGNOSIS — K3189 Other diseases of stomach and duodenum: Secondary | ICD-10-CM

## 2017-11-12 MED ORDER — IOPAMIDOL (ISOVUE-300) INJECTION 61%
100.0000 mL | Freq: Once | INTRAVENOUS | Status: AC | PRN
  Administered 2017-11-12: 100 mL via INTRAVENOUS

## 2017-11-28 DIAGNOSIS — N201 Calculus of ureter: Secondary | ICD-10-CM | POA: Diagnosis not present

## 2017-11-28 DIAGNOSIS — R8271 Bacteriuria: Secondary | ICD-10-CM | POA: Diagnosis not present

## 2017-12-12 ENCOUNTER — Encounter (HOSPITAL_COMMUNITY): Payer: Self-pay | Admitting: *Deleted

## 2017-12-12 ENCOUNTER — Other Ambulatory Visit: Payer: Self-pay | Admitting: Urology

## 2017-12-13 ENCOUNTER — Encounter (HOSPITAL_COMMUNITY): Payer: Self-pay | Admitting: *Deleted

## 2017-12-13 ENCOUNTER — Other Ambulatory Visit: Payer: Self-pay

## 2017-12-16 ENCOUNTER — Ambulatory Visit (HOSPITAL_COMMUNITY)
Admission: RE | Admit: 2017-12-16 | Discharge: 2017-12-16 | Disposition: A | Discharge status: Home / Self Care | Payer: 59 | Source: Ambulatory Visit | Attending: Urology | Admitting: Urology

## 2017-12-16 ENCOUNTER — Ambulatory Visit (HOSPITAL_COMMUNITY): Payer: 59 | Admitting: Certified Registered Nurse Anesthetist

## 2017-12-16 ENCOUNTER — Encounter (HOSPITAL_COMMUNITY)
Admission: RE | Disposition: A | Discharge status: Home / Self Care | Payer: Self-pay | Source: Ambulatory Visit | Attending: Urology

## 2017-12-16 ENCOUNTER — Ambulatory Visit (HOSPITAL_COMMUNITY): Payer: 59

## 2017-12-16 ENCOUNTER — Encounter (HOSPITAL_COMMUNITY): Payer: Self-pay | Admitting: Emergency Medicine

## 2017-12-16 ENCOUNTER — Other Ambulatory Visit: Payer: Self-pay

## 2017-12-16 DIAGNOSIS — Z79899 Other long term (current) drug therapy: Secondary | ICD-10-CM | POA: Diagnosis not present

## 2017-12-16 DIAGNOSIS — K219 Gastro-esophageal reflux disease without esophagitis: Secondary | ICD-10-CM | POA: Diagnosis not present

## 2017-12-16 DIAGNOSIS — I10 Essential (primary) hypertension: Secondary | ICD-10-CM | POA: Insufficient documentation

## 2017-12-16 DIAGNOSIS — S3692XA Contusion of unspecified intra-abdominal organ, initial encounter: Secondary | ICD-10-CM | POA: Diagnosis not present

## 2017-12-16 DIAGNOSIS — R51 Headache: Secondary | ICD-10-CM | POA: Diagnosis not present

## 2017-12-16 DIAGNOSIS — N201 Calculus of ureter: Secondary | ICD-10-CM | POA: Diagnosis not present

## 2017-12-16 HISTORY — DX: Headache, unspecified: R51.9

## 2017-12-16 HISTORY — DX: Personal history of urinary calculi: Z87.442

## 2017-12-16 HISTORY — PX: CYSTOSCOPY WITH RETROGRADE PYELOGRAM, URETEROSCOPY AND STENT PLACEMENT: SHX5789

## 2017-12-16 HISTORY — DX: Gastro-esophageal reflux disease without esophagitis: K21.9

## 2017-12-16 HISTORY — DX: Headache: R51

## 2017-12-16 LAB — CBC
HCT: 45.4 % (ref 39.0–52.0)
HEMOGLOBIN: 15.9 g/dL (ref 13.0–17.0)
MCH: 29.4 pg (ref 26.0–34.0)
MCHC: 35 g/dL (ref 30.0–36.0)
MCV: 84.1 fL (ref 78.0–100.0)
PLATELETS: 382 10*3/uL (ref 150–400)
RBC: 5.4 MIL/uL (ref 4.22–5.81)
RDW: 13.2 % (ref 11.5–15.5)
WBC: 7.9 10*3/uL (ref 4.0–10.5)

## 2017-12-16 LAB — BASIC METABOLIC PANEL
ANION GAP: 6 (ref 5–15)
BUN: 12 mg/dL (ref 6–20)
CALCIUM: 9 mg/dL (ref 8.9–10.3)
CO2: 27 mmol/L (ref 22–32)
CREATININE: 1.17 mg/dL (ref 0.61–1.24)
Chloride: 105 mmol/L (ref 101–111)
Glucose, Bld: 89 mg/dL (ref 65–99)
Potassium: 3.4 mmol/L — ABNORMAL LOW (ref 3.5–5.1)
SODIUM: 138 mmol/L (ref 135–145)

## 2017-12-16 SURGERY — CYSTOURETEROSCOPY, WITH RETROGRADE PYELOGRAM AND STENT INSERTION
Anesthesia: General | Site: Ureter | Laterality: Bilateral

## 2017-12-16 MED ORDER — FENTANYL CITRATE (PF) 100 MCG/2ML IJ SOLN
INTRAMUSCULAR | Status: AC
  Filled 2017-12-16: qty 2

## 2017-12-16 MED ORDER — TAMSULOSIN HCL 0.4 MG PO CAPS: 0.4000 mg | ORAL_CAPSULE | Freq: Every day | ORAL | Status: DC

## 2017-12-16 MED ORDER — ROCURONIUM BROMIDE 50 MG/5ML IV SOSY
PREFILLED_SYRINGE | INTRAVENOUS | Status: AC
  Filled 2017-12-16: qty 10

## 2017-12-16 MED ORDER — OXYBUTYNIN CHLORIDE 5 MG PO TABS: 5.0000 mg | ORAL_TABLET | Freq: Three times a day (TID) | ORAL | Status: DC | PRN

## 2017-12-16 MED ORDER — LACTATED RINGERS IV SOLN
INTRAVENOUS | Status: DC
  Administered 2017-12-16 (×3): via INTRAVENOUS

## 2017-12-16 MED ORDER — LIDOCAINE 2% (20 MG/ML) 5 ML SYRINGE
INTRAMUSCULAR | Status: DC | PRN
  Administered 2017-12-16: 60 mg via INTRAVENOUS

## 2017-12-16 MED ORDER — SODIUM CHLORIDE 0.9 % IR SOLN
Status: DC | PRN
  Administered 2017-12-16: 3000 mL via INTRAVESICAL
  Administered 2017-12-16: 1000 mL via INTRAVESICAL

## 2017-12-16 MED ORDER — OXYCODONE HCL 5 MG/5ML PO SOLN
5.0000 mg | Freq: Once | ORAL | Status: DC | PRN
  Filled 2017-12-16: qty 5

## 2017-12-16 MED ORDER — LIDOCAINE 2% (20 MG/ML) 5 ML SYRINGE
INTRAMUSCULAR | Status: AC
  Filled 2017-12-16: qty 10

## 2017-12-16 MED ORDER — HYDRALAZINE HCL 20 MG/ML IJ SOLN
5.0000 mg | Freq: Once | INTRAMUSCULAR | Status: AC
  Administered 2017-12-16: 5 mg via INTRAVENOUS

## 2017-12-16 MED ORDER — SUGAMMADEX SODIUM 200 MG/2ML IV SOLN
INTRAVENOUS | Status: AC
  Filled 2017-12-16: qty 2

## 2017-12-16 MED ORDER — ONDANSETRON HCL 4 MG/2ML IJ SOLN
INTRAMUSCULAR | Status: DC | PRN
  Administered 2017-12-16: 4 mg via INTRAVENOUS

## 2017-12-16 MED ORDER — MIDAZOLAM HCL 2 MG/2ML IJ SOLN
INTRAMUSCULAR | Status: AC
  Filled 2017-12-16: qty 2

## 2017-12-16 MED ORDER — FENTANYL CITRATE (PF) 250 MCG/5ML IJ SOLN: INTRAMUSCULAR | Status: DC | PRN

## 2017-12-16 MED ORDER — MIDAZOLAM HCL 2 MG/2ML IJ SOLN: 2.0000 mg | Freq: Once | INTRAMUSCULAR | Status: DC

## 2017-12-16 MED ORDER — PROMETHAZINE HCL 25 MG/ML IJ SOLN: 6.2500 mg | INTRAMUSCULAR | Status: DC | PRN

## 2017-12-16 MED ORDER — FENTANYL CITRATE (PF) 100 MCG/2ML IJ SOLN
INTRAMUSCULAR | Status: DC | PRN
  Administered 2017-12-16 (×4): 50 ug via INTRAVENOUS

## 2017-12-16 MED ORDER — OXYBUTYNIN CHLORIDE 5 MG PO TABS: 5.0000 mg | ORAL_TABLET | Freq: Three times a day (TID) | ORAL | 3 refills | Status: AC | PRN

## 2017-12-16 MED ORDER — HYDRALAZINE HCL 20 MG/ML IJ SOLN
INTRAMUSCULAR | Status: DC | PRN
  Administered 2017-12-16: 5 mg via INTRAVENOUS
  Administered 2017-12-16: 2.5 mg via INTRAVENOUS

## 2017-12-16 MED ORDER — HYDROCODONE-ACETAMINOPHEN 5-325 MG PO TABS: 1.0000 | ORAL_TABLET | ORAL | 0 refills | Status: DC | PRN

## 2017-12-16 MED ORDER — SODIUM CHLORIDE 0.9 % IR SOLN
Status: DC | PRN
  Administered 2017-12-16: 1000 mL via INTRAVESICAL

## 2017-12-16 MED ORDER — PROPOFOL 10 MG/ML IV BOLUS
INTRAVENOUS | Status: DC | PRN
  Administered 2017-12-16: 200 mg via INTRAVENOUS

## 2017-12-16 MED ORDER — HYDROMORPHONE HCL 1 MG/ML IJ SOLN: 0.2500 mg | INTRAMUSCULAR | Status: DC | PRN

## 2017-12-16 MED ORDER — HYDRALAZINE HCL 20 MG/ML IJ SOLN
INTRAMUSCULAR | Status: AC
  Filled 2017-12-16: qty 1

## 2017-12-16 MED ORDER — OXYCODONE HCL 5 MG PO TABS: 5.0000 mg | ORAL_TABLET | Freq: Once | ORAL | Status: DC | PRN

## 2017-12-16 MED ORDER — TAMSULOSIN HCL 0.4 MG PO CAPS: 0.4000 mg | ORAL_CAPSULE | Freq: Every day | ORAL | 3 refills | Status: AC

## 2017-12-16 MED ORDER — MIDAZOLAM HCL 2 MG/2ML IJ SOLN
INTRAMUSCULAR | Status: DC | PRN
  Administered 2017-12-16: 2 mg via INTRAVENOUS

## 2017-12-16 MED ORDER — SUCCINYLCHOLINE CHLORIDE 200 MG/10ML IV SOSY
PREFILLED_SYRINGE | INTRAVENOUS | Status: AC
  Filled 2017-12-16: qty 10

## 2017-12-16 MED ORDER — CEFAZOLIN SODIUM-DEXTROSE 2-4 GM/100ML-% IV SOLN
2.0000 g | INTRAVENOUS | Status: AC
  Administered 2017-12-16: 2 g via INTRAVENOUS
  Filled 2017-12-16: qty 100

## 2017-12-16 MED ORDER — ONDANSETRON HCL 4 MG/2ML IJ SOLN
INTRAMUSCULAR | Status: AC
  Filled 2017-12-16: qty 2

## 2017-12-16 MED ORDER — EPHEDRINE 5 MG/ML INJ
INTRAVENOUS | Status: AC
  Filled 2017-12-16: qty 20

## 2017-12-16 MED ORDER — PHENYLEPHRINE 40 MCG/ML (10ML) SYRINGE FOR IV PUSH (FOR BLOOD PRESSURE SUPPORT)
PREFILLED_SYRINGE | INTRAVENOUS | Status: DC | PRN
  Administered 2017-12-16: 40 ug via INTRAVENOUS
  Administered 2017-12-16 (×2): 80 ug via INTRAVENOUS

## 2017-12-16 MED ORDER — DEXAMETHASONE SODIUM PHOSPHATE 10 MG/ML IJ SOLN
INTRAMUSCULAR | Status: AC
  Filled 2017-12-16: qty 1

## 2017-12-16 MED ORDER — DEXAMETHASONE SODIUM PHOSPHATE 10 MG/ML IJ SOLN
INTRAMUSCULAR | Status: DC | PRN
  Administered 2017-12-16: 10 mg via INTRAVENOUS

## 2017-12-16 MED ORDER — PHENYLEPHRINE 40 MCG/ML (10ML) SYRINGE FOR IV PUSH (FOR BLOOD PRESSURE SUPPORT)
PREFILLED_SYRINGE | INTRAVENOUS | Status: AC
  Filled 2017-12-16: qty 30

## 2017-12-16 MED ORDER — 0.9 % SODIUM CHLORIDE (POUR BTL) OPTIME
TOPICAL | Status: DC | PRN
  Administered 2017-12-16: 1000 mL

## 2017-12-16 MED ORDER — PROPOFOL 10 MG/ML IV BOLUS
INTRAVENOUS | Status: AC
  Filled 2017-12-16: qty 20

## 2017-12-16 MED ORDER — SODIUM CHLORIDE 0.9 % IJ SOLN
INTRAMUSCULAR | Status: AC
  Filled 2017-12-16: qty 10

## 2017-12-16 SURGICAL SUPPLY — 23 items
BAG URO CATCHER STRL LF (MISCELLANEOUS) ×3 IMPLANT
BASKET LASER NITINOL 1.9FR (BASKET) ×3 IMPLANT
BASKET ZERO TIP NITINOL 2.4FR (BASKET) IMPLANT
CATH INTERMIT  6FR 70CM (CATHETERS) ×3 IMPLANT
CATH URET 5FR 28IN CONE TIP (BALLOONS)
CATH URET 5FR 70CM CONE TIP (BALLOONS) IMPLANT
CLOTH BEACON ORANGE TIMEOUT ST (SAFETY) ×6 IMPLANT
COVER FOOTSWITCH UNIV (MISCELLANEOUS) ×3 IMPLANT
FIBER LASER FLEXIVA 365 (UROLOGICAL SUPPLIES) IMPLANT
FIBER LASER TRAC TIP (UROLOGICAL SUPPLIES) IMPLANT
GOWN STRL REUS W/TWL LRG LVL3 (GOWN DISPOSABLE) ×3 IMPLANT
GOWN STRL REUS W/TWL XL LVL3 (GOWN DISPOSABLE) IMPLANT
GUIDEWIRE ANG ZIPWIRE 038X150 (WIRE) IMPLANT
GUIDEWIRE STR DUAL SENSOR (WIRE) ×6 IMPLANT
INFUSOR MANOMETER BAG 3000ML (MISCELLANEOUS) IMPLANT
MANIFOLD NEPTUNE II (INSTRUMENTS) ×3 IMPLANT
PACK CYSTO (CUSTOM PROCEDURE TRAY) ×3 IMPLANT
SHEATH ACCESS URETERAL 24CM (SHEATH) IMPLANT
SHEATH ACCESS URETERAL 54CM (SHEATH) IMPLANT
SHEATH URETERAL 12FRX35CM (MISCELLANEOUS) ×3 IMPLANT
STENT CONTOUR 6FRX26X.038 (STENTS) ×6 IMPLANT
SYRINGE 10CC LL (SYRINGE) ×3 IMPLANT
TUBE FEEDING 8FR 16IN STR KANG (MISCELLANEOUS) IMPLANT

## 2017-12-16 NOTE — Anesthesia Procedure Notes (Signed)
Date/Time: 12/16/2017 6:10 PM Performed by: Cynda Familia, CRNA Oxygen Delivery Method: Simple face mask Placement Confirmation: breath sounds checked- equal and bilateral and positive ETCO2 Dental Injury: Teeth and Oropharynx as per pre-operative assessment  Comments: Extubated to face mask

## 2017-12-16 NOTE — H&P (View-Only) (Signed)
CC: I have kidney stones.  HPI: Johnny Barker is a 47 year-old male patient who was referred by Dr. Donavan Burnet, MD who is here for renal calculi.  The problem is on the right side. This is not his first kidney stone. He has had 1 stones prior to getting this one. He is not currently having flank pain, back pain, groin pain, nausea, vomiting, fever or chills. He has not caught a stone in his urine strainer since his symptoms began.   He has never had surgical treatment for calculi in the past.   Patient had a kidney stone back in 03-23-99 that he passed on his own. He had a follow-up CT scan on 11/12/2017 for follow-up pelvic adenopathy and this incidentally found a 8 mm right proximal ureteral calculus without stream hydroureteronephrosis. The patient has been asymptomatic. There is also a left pelvic calcification that is a ureteral versus phlebolith. There is no hydronephrosis on the left.   ALLERGIES: None   MEDICATIONS: None   GU PSH: None   NON-GU PSH: Splenectomy   GU PMH: None   NON-GU PMH: Hypertension Secondary malignant neoplasm of retroperitoneum and peritoneum   FAMILY HISTORY: None    Notes: 1 daughter   SOCIAL HISTORY: Marital Status: Married Preferred Language: English; Ethnicity: Not Hispanic Or Latino; Race: Black or African American Current Smoking Status: Patient has never smoked.   Tobacco Use Assessment Completed:  Used Tobacco in last 30 days?  Does not use smokeless tobacco. Has never drank.  Drinks 1 caffeinated drink per day.   REVIEW OF SYSTEMS:    GU Review Male:   Patient reports get up at night to urinate. Patient denies frequent urination, hard to postpone urination, burning/ pain with urination, leakage of urine, stream starts and stops, trouble starting your stream, have to strain to urinate , erection problems, and penile pain.  Gastrointestinal (Upper):   Patient reports indigestion/ heartburn. Patient denies nausea and vomiting.   Gastrointestinal (Lower):   Patient denies diarrhea and constipation.  Constitutional:   Patient denies fever, night sweats, weight loss, and fatigue.  Skin:   Patient denies skin rash/ lesion and itching.  Eyes:   Patient denies blurred vision and double vision.  Ears/ Nose/ Throat:   Patient reports sinus problems. Patient denies sore throat.  Hematologic/Lymphatic:   Patient denies swollen glands and easy bruising.  Cardiovascular:   Patient denies leg swelling and chest pains.  Respiratory:   Patient denies cough and shortness of breath.  Endocrine:   Patient denies excessive thirst.  Musculoskeletal:   Patient denies back pain and joint pain.  Neurological:   Patient reports headaches. Patient denies dizziness.  Psychologic:   Patient denies depression and anxiety.   VITAL SIGNS:      11/28/2017 11:56 AM  Weight 209 lb / 94.8 kg  Height 75 in / 190.5 cm  BP 188/108 mmHg  Pulse 76 /min  Temperature 98.0 F / 36.6 C  BMI 26.1 kg/m   MULTI-SYSTEM PHYSICAL EXAMINATION:    Constitutional: Well-nourished. No physical deformities. Normally developed. Good grooming.  Neck: Neck symmetrical, not swollen. Normal tracheal position.  Respiratory: No labored breathing, no use of accessory muscles.   Cardiovascular: Normal temperature, adequate peripheral perfusion  Skin: No paleness, no jaundice  Neurologic / Psychiatric: Oriented to time, oriented to place, oriented to person. No depression, no anxiety, no agitation.  Gastrointestinal: No mass, no tenderness, no rigidity, non obese abdomen.  Eyes: Normal conjunctivae. Normal eyelids.  Musculoskeletal:  Normal gait and station of head and neck.    PAST DATA REVIEWED:  Source Of History:  Patient  Records Review:   Previous Doctor Records  X-Ray Review: C.T. Abdomen/Pelvis: Reviewed Films. Reviewed Report. Discussed With Patient.    PROCEDURES:         KUB - [id number]  A single view of the abdomen is obtained. Large right ureteral  calculus redemonstrated on KUB. There is a 6 mm radiopaque area in the left lower pelvis corresponding to the phlebolith mention on CT scan but is also in the area of the ureter.              Urinalysis Dipstick Dipstick Cont'd  Color: Yellow Bilirubin: Neg  Appearance: Clear Ketones: Neg  Specific Gravity: 1.020 Blood: Neg  pH: 7.0 Protein: Trace  Glucose: Neg Urobilinogen: 0.2    Nitrites: Neg    Leukocyte Esterase: Neg   ASSESSMENT:      ICD-10 Details  1 GU:   Ureteral calculus - N20.1    PLAN:          Orders Labs Urine Culture  X-Rays: KUB         Schedule          Document Letter(s):  Created for Patient: Clinical Summary        Notes:   We discussed the management of urinary stones. These options include observation, ureteroscopy, and shockwave lithotripsy. We discussed which options are relevant to these particular stones. We discussed the natural history of stones as well as the complications of untreated stones and the impact on quality of life without treatment as well as with each of the above listed treatments. We also discussed the efficacy of each treatment in its ability to clear the stone burden. With any of these management options I discussed the signs and symptoms of infection and the need for emergent treatment should these be experienced. For each option we discussed the ability of each procedure to clear the patient of their stone burden.   For observation I described the risks which include but are not limited to silent renal damage, life-threatening infection, need for emergent surgery, failure to pass stone, and pain.   For ureteroscopy I described the risks which include heart attack, stroke, pulmonary embolus, death, bleeding, infection, damage to contiguous structures, positioning injury, ureteral stricture, ureteral avulsion, ureteral injury, need for ureteral stent, inability to perform ureteroscopy, need for an interval procedure, inability to clear  stone burden, stent discomfort and pain.   For shockwave lithotripsy I described the risks which include arrhythmia, kidney contusion, kidney hemorrhage, need for transfusion, pain, inability to break up stone, inability to pass stone fragments, Steinstrasse, infection associated with obstructing stones, need for different surgical procedure, need for repeat shockwave lithotripsy, and death.   Given that he has a possible stone on the left which is thought to be a phlebolith by radiology, I'll recommend ureteroscopy on the right with laser lithotripsy and ureteral stent placement. While where there I will do a semirigid ureteroscopy on the left confirmed that that is in fact a phlebolith. There is no hydronephrosis and he is asymptomatic.   Signed by Link Snuffer, III, M.D. on 11/28/17 at 1:08 PM (EST)

## 2017-12-16 NOTE — Op Note (Signed)
Operative Note  Preoperative diagnosis:  1.  Right ureteral calculus with a possible left ureteral calculus  Postoperative diagnosis: 1.  Bilateral ureteral calculi  Procedure(s): 1.  Cystoscopy with bilateral ureteroscopy with laser lithotripsy and ureteral stent placement  Surgeon: Link Snuffer, MD  Assistants: None  Anesthesia: General  Complications: None immediate  EBL: Minimal  Specimens: 1.  None  Drains/Catheters: 1.  Bilateral 6 x 26 double-J ureteral stent  Intraoperative findings: Right mid to proximal 8 mm ureteral calculus 2.  Left distal 7 mm ureteral calculus 3.  Normal urethra and bladder  Indication: 47 year old male with a history of cancer who underwent a PET scan to evaluate lymphadenopathy was found to have a right proximal ureteral calculus and a possible left nonobstructing ureteral calculus.  The decision was made to proceed with the above operation.  Description of procedure:  The patient was identified and consent was obtained.  The patient was taken to the operating room and placed in the supine position.  The patient was placed under general anesthesia.  Perioperative antibiotics were administered.  The patient was placed in dorsal lithotomy.  Patient was prepped and draped in a standard sterile fashion and a timeout was performed.  A 21 French rigid cystoscope was advanced into the urethra and into the bladder.  A complete cystoscopy was performed with no abnormal findings.  A sensor wire was advanced into the right ureter and up into the kidney under fluoroscopic guidance.  A semirigid ureteroscope was advanced alongside the wire up to the stone of interest which was fragmented to smaller fragments.  These were pushed out of reach of the semirigid ureteroscope and were unable to be basketed due to their size as well.  I therefore advanced a second wire through the ureteroscope and withdrew it.  I then attempted to advance a 12 x 14 ureteral access  sheath which advanced partially up the ureter and then met resistance.  I removed the inner sheath and tried to perform flexible ureteroscopy but the ureter was too narrow therefore this was abandoned.  I backloaded the wire onto a rigid cystoscope and advanced that into the bladder.  A 6 x 26 double-J ureteral stent was placed followed by removal of the wire.  Fluoroscopy confirmed proximal placement and direct visualization confirmed a good coil within the bladder.  I then advanced a sensor wire up the left ureter and into the kidney under fluoroscopic guidance.  I advanced a semirigid ureteroscope alongside the wire to the stone of interest which was fragmented to 1-2 mm fragments.  There were no other significant stone fragments seen along the course of the ureter.  I backloaded the wire onto a rigid cystoscope and advanced that into the bladder.  I then placed a 6 x 26 double-J ureteral stent over the wire and removed it.  Fluoroscopy confirmed proximal placement of the stent and direct visualization confirmed a good coil within the bladder.  I then drained the bladder and withdrew the scope and this concluded the operation.  Patient tolerated procedure well and was stable postoperatively.  Plan: The patient will need to be rescheduled for a left ureteral stent removal and right completion ureteroscopy with laser lithotripsy and ureteral stent exchange.

## 2017-12-16 NOTE — H&P (Signed)
CC: I have kidney stones.  HPI: Johnny Barker is a 47 year-old male patient who was referred by Dr. Donavan Burnet, MD who is here for renal calculi.  The problem is on the right side. This is not his first kidney stone. He has had 1 stones prior to getting this one. He is not currently having flank pain, back pain, groin pain, nausea, vomiting, fever or chills. He has not caught a stone in his urine strainer since his symptoms began.   He has never had surgical treatment for calculi in the past.   Patient had a kidney stone back in 1999/03/15 that he passed on his own. He had a follow-up CT scan on 11/12/2017 for follow-up pelvic adenopathy and this incidentally found a 8 mm right proximal ureteral calculus without stream hydroureteronephrosis. The patient has been asymptomatic. There is also a left pelvic calcification that is a ureteral versus phlebolith. There is no hydronephrosis on the left.   ALLERGIES: None   MEDICATIONS: None   GU PSH: None   NON-GU PSH: Splenectomy   GU PMH: None   NON-GU PMH: Hypertension Secondary malignant neoplasm of retroperitoneum and peritoneum   FAMILY HISTORY: None    Notes: 1 daughter   SOCIAL HISTORY: Marital Status: Married Preferred Language: English; Ethnicity: Not Hispanic Or Latino; Race: Black or African American Current Smoking Status: Patient has never smoked.   Tobacco Use Assessment Completed:  Used Tobacco in last 30 days?  Does not use smokeless tobacco. Has never drank.  Drinks 1 caffeinated drink per day.   REVIEW OF SYSTEMS:    GU Review Male:   Patient reports get up at night to urinate. Patient denies frequent urination, hard to postpone urination, burning/ pain with urination, leakage of urine, stream starts and stops, trouble starting your stream, have to strain to urinate , erection problems, and penile pain.  Gastrointestinal (Upper):   Patient reports indigestion/ heartburn. Patient denies nausea and vomiting.   Gastrointestinal (Lower):   Patient denies diarrhea and constipation.  Constitutional:   Patient denies fever, night sweats, weight loss, and fatigue.  Skin:   Patient denies skin rash/ lesion and itching.  Eyes:   Patient denies blurred vision and double vision.  Ears/ Nose/ Throat:   Patient reports sinus problems. Patient denies sore throat.  Hematologic/Lymphatic:   Patient denies swollen glands and easy bruising.  Cardiovascular:   Patient denies leg swelling and chest pains.  Respiratory:   Patient denies cough and shortness of breath.  Endocrine:   Patient denies excessive thirst.  Musculoskeletal:   Patient denies back pain and joint pain.  Neurological:   Patient reports headaches. Patient denies dizziness.  Psychologic:   Patient denies depression and anxiety.   VITAL SIGNS:      11/28/2017 11:56 AM  Weight 209 lb / 94.8 kg  Height 75 in / 190.5 cm  BP 188/108 mmHg  Pulse 76 /min  Temperature 98.0 F / 36.6 C  BMI 26.1 kg/m   MULTI-SYSTEM PHYSICAL EXAMINATION:    Constitutional: Well-nourished. No physical deformities. Normally developed. Good grooming.  Neck: Neck symmetrical, not swollen. Normal tracheal position.  Respiratory: No labored breathing, no use of accessory muscles.   Cardiovascular: Normal temperature, adequate peripheral perfusion  Skin: No paleness, no jaundice  Neurologic / Psychiatric: Oriented to time, oriented to place, oriented to person. No depression, no anxiety, no agitation.  Gastrointestinal: No mass, no tenderness, no rigidity, non obese abdomen.  Eyes: Normal conjunctivae. Normal eyelids.  Musculoskeletal:  Normal gait and station of head and neck.    PAST DATA REVIEWED:  Source Of History:  Patient  Records Review:   Previous Doctor Records  X-Ray Review: C.T. Abdomen/Pelvis: Reviewed Films. Reviewed Report. Discussed With Patient.    PROCEDURES:         KUB - [id number]  A single view of the abdomen is obtained. Large right ureteral  calculus redemonstrated on KUB. There is a 6 mm radiopaque area in the left lower pelvis corresponding to the phlebolith mention on CT scan but is also in the area of the ureter.              Urinalysis Dipstick Dipstick Cont'd  Color: Yellow Bilirubin: Neg  Appearance: Clear Ketones: Neg  Specific Gravity: 1.020 Blood: Neg  pH: 7.0 Protein: Trace  Glucose: Neg Urobilinogen: 0.2    Nitrites: Neg    Leukocyte Esterase: Neg   ASSESSMENT:      ICD-10 Details  1 GU:   Ureteral calculus - N20.1    PLAN:          Orders Labs Urine Culture  X-Rays: KUB         Schedule          Document Letter(s):  Created for Patient: Clinical Summary        Notes:   We discussed the management of urinary stones. These options include observation, ureteroscopy, and shockwave lithotripsy. We discussed which options are relevant to these particular stones. We discussed the natural history of stones as well as the complications of untreated stones and the impact on quality of life without treatment as well as with each of the above listed treatments. We also discussed the efficacy of each treatment in its ability to clear the stone burden. With any of these management options I discussed the signs and symptoms of infection and the need for emergent treatment should these be experienced. For each option we discussed the ability of each procedure to clear the patient of their stone burden.   For observation I described the risks which include but are not limited to silent renal damage, life-threatening infection, need for emergent surgery, failure to pass stone, and pain.   For ureteroscopy I described the risks which include heart attack, stroke, pulmonary embolus, death, bleeding, infection, damage to contiguous structures, positioning injury, ureteral stricture, ureteral avulsion, ureteral injury, need for ureteral stent, inability to perform ureteroscopy, need for an interval procedure, inability to clear  stone burden, stent discomfort and pain.   For shockwave lithotripsy I described the risks which include arrhythmia, kidney contusion, kidney hemorrhage, need for transfusion, pain, inability to break up stone, inability to pass stone fragments, Steinstrasse, infection associated with obstructing stones, need for different surgical procedure, need for repeat shockwave lithotripsy, and death.   Given that he has a possible stone on the left which is thought to be a phlebolith by radiology, I'll recommend ureteroscopy on the right with laser lithotripsy and ureteral stent placement. While where there I will do a semirigid ureteroscopy on the left confirmed that that is in fact a phlebolith. There is no hydronephrosis and he is asymptomatic.   Signed by Link Snuffer, III, M.D. on 11/28/17 at 1:08 PM (EST)

## 2017-12-16 NOTE — Transfer of Care (Signed)
Immediate Anesthesia Transfer of Care Note  Patient: Johnny Barker  Procedure(s) Performed: CYSTOSCOPY, BILATERAL URETEROSCOPY, BILATERAL LASER LITHOTRIPSY  AND BILATERAL URETERAL STENT PLACEMENT (Bilateral Ureter)  Patient Location: PACU  Anesthesia Type:General  Level of Consciousness: awake and alert   Airway & Oxygen Therapy: Patient Spontanous Breathing and Patient connected to face mask oxygen  Post-op Assessment: Report given to RN and Post -op Vital signs reviewed and stable  Post vital signs: Reviewed and stable  Last Vitals:  Vitals:   12/16/17 1357 12/16/17 1557  BP: (!) 174/117 (!) 179/111  Pulse: 90   Resp: 18   Temp: 36.7 C   SpO2: 99%     Last Pain:  Vitals:   12/16/17 1357  TempSrc: Oral      Patients Stated Pain Goal: 4 (44/17/12 7871)  Complications: No apparent anesthesia complications

## 2017-12-16 NOTE — Discharge Instructions (Addendum)
[facility] °[phone] °Post Ureteroscopy With or Without Stent Instructions ° °Definitions: ° °Ureter: The duct that transports urine from the kidney to the bladder. °Stent:   A plastic hollow tube that is placed into the ureter, from the kidney to the                 bladder to prevent the ureter from swelling shut. ° °GENERAL INSTRUCTIONS: ° °Despite the fact that no skin incisions were used, the area around the ureter and bladder is raw and irritated. The stent is a foreign body which will further irritate the bladder wall. This irritation is manifested by increased frequency of urination, both day and night, and by an increase in the urge to urinate. In some, the urge to urinate is present almost always. Sometimes the urge is strong enough that you may not be able to stop yourself from urinating. The only real cure is to remove the stent and then give time for the bladder wall to heal which can't be done until the danger of the ureter swelling shut has passed, which varies. ° °You may see some blood in your urine while the stent is in place and a few days afterwards. Do not be alarmed, even if the urine was clear for a while. Get off your feet and drink lots of fluids until clearing occurs. If you start to pass clots or don't improve, call us. ° °DIET: °You may return to your normal diet immediately. Because of the raw surface of your bladder, alcohol, spicy foods, acid type foods and drinks with caffeine may cause irritation or frequency and should be used in moderation. To keep your urine flowing freely and to avoid constipation, drink plenty of fluids during the day ( 8-10 glasses ). °Tip: Avoid cranberry juice because it is very acidic. ° °ACTIVITY: °Your physical activity doesn't need to be restricted. However, if you are very active, you may see some blood in your urine. We suggest that you reduce your activity under these circumstances until the bleeding has stopped. ° °BOWELS: °It is  important to keep your bowels regular during the postoperative period. Straining with bowel movements can cause bleeding. A bowel movement every other day is reasonable. Use a mild laxative if needed, such as Milk of Magnesia 2-3 tablespoons, or 2 Dulcolax tablets. Call if you continue to have problems. If you have been taking narcotics for pain, before, during or after your surgery, you may be constipated. Take a laxative if necessary. ° ° °MEDICATION: °You should resume your pre-surgery medications unless told not to. °You may take oxybutynin or flomax if prescribed for bladder spasms or discomfort from the stent °Take pain medication as directed for pain refractory to conservative management ° °PROBLEMS YOU SHOULD REPORT TO US: °· Fevers over 100.5 Fahrenheit. °· Heavy bleeding, or clots ( See above notes about blood in urine ). °· Inability to urinate. °· Drug reactions ( hives, rash, nausea, vomiting, diarrhea ). °· Severe burning or pain with urination that is not improving. ° °General Anesthesia, Adult, Care After °These instructions provide you with information about caring for yourself after your procedure. Your health care provider may also give you more specific instructions. Your treatment has been planned according to current medical practices, but problems sometimes occur. Call your health care provider if you have any problems or questions after your procedure. °What can I expect after the procedure? °After the procedure, it is common to have: °· Vomiting. °· A sore   throat. °· Mental slowness. ° °It is common to feel: °· Nauseous. °· Cold or shivery. °· Sleepy. °· Tired. °· Sore or achy, even in parts of your body where you did not have surgery. ° °Follow these instructions at home: °For at least 24 hours after the procedure: °· Do not: °? Participate in activities where you could fall or become injured. °? Drive. °? Use heavy machinery. °? Drink alcohol. °? Take sleeping pills or medicines that cause  drowsiness. °? Make important decisions or sign legal documents. °? Take care of children on your own. °· Rest. °Eating and drinking °· If you vomit, drink water, juice, or soup when you can drink without vomiting. °· Drink enough fluid to keep your urine clear or pale yellow. °· Make sure you have little or no nausea before eating solid foods. °· Follow the diet recommended by your health care provider. °General instructions °· Have a responsible adult stay with you until you are awake and alert. °· Return to your normal activities as told by your health care provider. Ask your health care provider what activities are safe for you. °· Take over-the-counter and prescription medicines only as told by your health care provider. °· If you smoke, do not smoke without supervision. °· Keep all follow-up visits as told by your health care provider. This is important. °Contact a health care provider if: °· You continue to have nausea or vomiting at home, and medicines are not helpful. °· You cannot drink fluids or start eating again. °· You cannot urinate after 8-12 hours. °· You develop a skin rash. °· You have fever. °· You have increasing redness at the site of your procedure. °Get help right away if: °· You have difficulty breathing. °· You have chest pain. °· You have unexpected bleeding. °· You feel that you are having a life-threatening or urgent problem. °This information is not intended to replace advice given to you by your health care provider. Make sure you discuss any questions you have with your health care provider. °Document Released: 03/04/2001 Document Revised: 04/30/2016 Document Reviewed: 11/10/2015 °Elsevier Interactive Patient Education © 2018 [employer] ° ° °

## 2017-12-16 NOTE — Interval H&P Note (Signed)
History and Physical Interval Note:  12/16/2017 3:55 PM  Edwin Dada  has presented today for surgery, with the diagnosis of RIGHT URETERAL STONE POSSIBLE LEFT  The various methods of treatment have been discussed with the patient and family. After consideration of risks, benefits and other options for treatment, the patient has consented to  Procedure(s): CYSTOSCOPY WITH BILATERAL RETROGRADE PYELOGRAM, URETEROSCOPY AND STENT PLACEMENT (Bilateral) as a surgical intervention .  The patient's history has been reviewed, patient examined, no change in status, stable for surgery.  I have reviewed the patient's chart and labs.  Questions were answered to the patient's satisfaction.     Marton Redwood, III

## 2017-12-16 NOTE — Anesthesia Procedure Notes (Signed)
Procedure Name: LMA Insertion Date/Time: 12/16/2017 4:59 PM Performed by: Cynda Familia, CRNA Pre-anesthesia Checklist: Patient identified, Emergency Drugs available, Suction available and Patient being monitored Patient Re-evaluated:Patient Re-evaluated prior to induction Oxygen Delivery Method: Circle System Utilized Preoxygenation: Pre-oxygenation with 100% oxygen Induction Type: IV induction Ventilation: Mask ventilation without difficulty LMA: LMA inserted LMA Size: 4.0 Number of attempts: 1 Placement Confirmation: positive ETCO2 Tube secured with: Tape Dental Injury: Teeth and Oropharynx as per pre-operative assessment  Comments: Smooth IV induction Ellender--- LMA  AM CRNA--- atraumatic-- teeth and mouth as preop-- bilat BS  Ellender

## 2017-12-16 NOTE — Anesthesia Preprocedure Evaluation (Addendum)
Anesthesia Evaluation  Patient identified by MRN, date of birth, ID band Patient awake    Reviewed: Allergy & Precautions, NPO status , Patient's Chart, lab work & pertinent test results  Airway Mallampati: II  TM Distance: >3 FB Neck ROM: Full    Dental no notable dental hx. (+) Dental Advisory Given   Pulmonary neg pulmonary ROS,    Pulmonary exam normal breath sounds clear to auscultation       Cardiovascular hypertension, Normal cardiovascular exam Rhythm:Regular Rate:Normal  ECG: NSR, rate 70   Neuro/Psych  Headaches, negative psych ROS   GI/Hepatic Neg liver ROS, GERD  Controlled,  Endo/Other  negative endocrine ROS  Renal/GU negative Renal ROS     Musculoskeletal negative musculoskeletal ROS (+)   Abdominal   Peds  Hematology negative hematology ROS (+)   Anesthesia Other Findings RIGHT URETERAL STONE POSSIBLE LEFT  Reproductive/Obstetrics                           Anesthesia Physical Anesthesia Plan  ASA: II  Anesthesia Plan: General   Post-op Pain Management:    Induction: Intravenous  PONV Risk Score and Plan: 2 and Midazolam, Dexamethasone, Ondansetron and Treatment may vary due to age or medical condition  Airway Management Planned: LMA  Additional Equipment:   Intra-op Plan:   Post-operative Plan: Extubation in OR  Informed Consent: I have reviewed the patients History and Physical, chart, labs and discussed the procedure including the risks, benefits and alternatives for the proposed anesthesia with the patient or authorized representative who has indicated his/her understanding and acceptance.   Dental advisory given  Plan Discussed with: CRNA  Anesthesia Plan Comments:         Anesthesia Quick Evaluation

## 2017-12-16 NOTE — Anesthesia Procedure Notes (Signed)
Date/Time: 12/16/2017 4:05 PM Performed by: Cynda Familia, CRNA Pre-anesthesia Checklist: Patient identified, Emergency Drugs available, Suction available, Patient being monitored and Timeout performed Oxygen Delivery Method: Nasal cannula Placement Confirmation: breath sounds checked- equal and bilateral and positive ETCO2 Comments: [place] O 2 for sedation

## 2017-12-17 ENCOUNTER — Encounter (HOSPITAL_COMMUNITY): Payer: Self-pay | Admitting: Urology

## 2017-12-17 NOTE — Anesthesia Postprocedure Evaluation (Signed)
Anesthesia Post Note  Patient: Johnny Barker  Procedure(s) Performed: CYSTOSCOPY, BILATERAL URETEROSCOPY, BILATERAL LASER LITHOTRIPSY  AND BILATERAL URETERAL STENT PLACEMENT (Bilateral Ureter)     Patient location during evaluation: PACU Anesthesia Type: General Level of consciousness: awake and alert Pain management: pain level controlled Vital Signs Assessment: post-procedure vital signs reviewed and stable Respiratory status: spontaneous breathing, nonlabored ventilation, respiratory function stable and patient connected to nasal cannula oxygen Cardiovascular status: blood pressure returned to baseline and stable Postop Assessment: no apparent nausea or vomiting Anesthetic complications: no    Last Vitals:  Vitals:   12/16/17 1910 12/16/17 1948  BP: 131/83 134/82  Pulse: (!) 56 73  Resp:  20  Temp:  36.6 C  SpO2: 100% 99%    Last Pain:  Vitals:   12/17/17 1804  TempSrc:   PainSc: 3                  Ryan P Ellender

## 2017-12-18 ENCOUNTER — Other Ambulatory Visit: Payer: Self-pay | Admitting: Urology

## 2018-01-10 ENCOUNTER — Other Ambulatory Visit: Payer: Self-pay

## 2018-01-10 ENCOUNTER — Encounter (HOSPITAL_COMMUNITY): Payer: Self-pay | Admitting: *Deleted

## 2018-01-13 ENCOUNTER — Encounter (HOSPITAL_COMMUNITY)
Admission: RE | Admit: 2018-01-13 | Discharge: 2018-01-13 | Disposition: A | Discharge status: Home / Self Care | Payer: 59 | Source: Ambulatory Visit | Attending: Urology | Admitting: Urology

## 2018-01-13 DIAGNOSIS — I10 Essential (primary) hypertension: Secondary | ICD-10-CM | POA: Diagnosis not present

## 2018-01-13 DIAGNOSIS — K219 Gastro-esophageal reflux disease without esophagitis: Secondary | ICD-10-CM | POA: Diagnosis not present

## 2018-01-13 DIAGNOSIS — N201 Calculus of ureter: Secondary | ICD-10-CM | POA: Diagnosis not present

## 2018-01-13 DIAGNOSIS — Z79899 Other long term (current) drug therapy: Secondary | ICD-10-CM | POA: Diagnosis not present

## 2018-01-13 DIAGNOSIS — N200 Calculus of kidney: Secondary | ICD-10-CM | POA: Diagnosis present

## 2018-01-13 DIAGNOSIS — Z9081 Acquired absence of spleen: Secondary | ICD-10-CM | POA: Diagnosis not present

## 2018-01-13 DIAGNOSIS — Z87442 Personal history of urinary calculi: Secondary | ICD-10-CM | POA: Diagnosis not present

## 2018-01-13 DIAGNOSIS — C786 Secondary malignant neoplasm of retroperitoneum and peritoneum: Secondary | ICD-10-CM | POA: Diagnosis not present

## 2018-01-13 LAB — CBC
HCT: 44.1 % (ref 39.0–52.0)
Hemoglobin: 15.5 g/dL (ref 13.0–17.0)
MCH: 29 pg (ref 26.0–34.0)
MCHC: 35.1 g/dL (ref 30.0–36.0)
MCV: 82.6 fL (ref 78.0–100.0)
PLATELETS: 414 10*3/uL — AB (ref 150–400)
RBC: 5.34 MIL/uL (ref 4.22–5.81)
RDW: 13.1 % (ref 11.5–15.5)
WBC: 6.8 10*3/uL (ref 4.0–10.5)

## 2018-01-13 LAB — BASIC METABOLIC PANEL
Anion gap: 3 — ABNORMAL LOW (ref 5–15)
BUN: 17 mg/dL (ref 6–20)
CHLORIDE: 107 mmol/L (ref 101–111)
CO2: 28 mmol/L (ref 22–32)
Calcium: 8.9 mg/dL (ref 8.9–10.3)
Creatinine, Ser: 1.17 mg/dL (ref 0.61–1.24)
GFR calc non Af Amer: >60 mL/min (ref >60)
Glucose, Bld: 99 mg/dL (ref 65–99)
Potassium: 3.9 mmol/L (ref 3.5–5.1)
SODIUM: 138 mmol/L (ref 135–145)

## 2018-01-15 ENCOUNTER — Ambulatory Visit (HOSPITAL_COMMUNITY)
Admission: RE | Admit: 2018-01-15 | Discharge: 2018-01-15 | Disposition: A | Discharge status: Home / Self Care | Payer: 59 | Source: Ambulatory Visit | Attending: Urology | Admitting: Urology

## 2018-01-15 ENCOUNTER — Other Ambulatory Visit: Payer: Self-pay

## 2018-01-15 ENCOUNTER — Ambulatory Visit (HOSPITAL_COMMUNITY): Payer: 59

## 2018-01-15 ENCOUNTER — Ambulatory Visit (HOSPITAL_COMMUNITY): Payer: 59 | Admitting: Registered Nurse

## 2018-01-15 ENCOUNTER — Encounter (HOSPITAL_COMMUNITY): Payer: Self-pay | Admitting: *Deleted

## 2018-01-15 ENCOUNTER — Encounter (HOSPITAL_COMMUNITY)
Admission: RE | Disposition: A | Discharge status: Home / Self Care | Payer: Self-pay | Source: Ambulatory Visit | Attending: Urology

## 2018-01-15 DIAGNOSIS — Z9081 Acquired absence of spleen: Secondary | ICD-10-CM | POA: Diagnosis not present

## 2018-01-15 DIAGNOSIS — I10 Essential (primary) hypertension: Secondary | ICD-10-CM | POA: Insufficient documentation

## 2018-01-15 DIAGNOSIS — R599 Enlarged lymph nodes, unspecified: Secondary | ICD-10-CM | POA: Diagnosis not present

## 2018-01-15 DIAGNOSIS — N201 Calculus of ureter: Secondary | ICD-10-CM | POA: Diagnosis not present

## 2018-01-15 DIAGNOSIS — K219 Gastro-esophageal reflux disease without esophagitis: Secondary | ICD-10-CM | POA: Diagnosis not present

## 2018-01-15 DIAGNOSIS — N202 Calculus of kidney with calculus of ureter: Secondary | ICD-10-CM | POA: Diagnosis not present

## 2018-01-15 DIAGNOSIS — Z87442 Personal history of urinary calculi: Secondary | ICD-10-CM | POA: Insufficient documentation

## 2018-01-15 DIAGNOSIS — Z79899 Other long term (current) drug therapy: Secondary | ICD-10-CM | POA: Insufficient documentation

## 2018-01-15 DIAGNOSIS — C786 Secondary malignant neoplasm of retroperitoneum and peritoneum: Secondary | ICD-10-CM | POA: Insufficient documentation

## 2018-01-15 HISTORY — PX: HOLMIUM LASER APPLICATION: SHX5852

## 2018-01-15 HISTORY — PX: CYSTOSCOPY WITH RETROGRADE PYELOGRAM, URETEROSCOPY AND STENT PLACEMENT: SHX5789

## 2018-01-15 SURGERY — CYSTOURETEROSCOPY, WITH RETROGRADE PYELOGRAM AND STENT INSERTION
Anesthesia: General | Laterality: Bilateral

## 2018-01-15 MED ORDER — IOHEXOL 300 MG/ML  SOLN
INTRAMUSCULAR | Status: DC | PRN
  Administered 2018-01-15: 35 mL

## 2018-01-15 MED ORDER — SODIUM CHLORIDE 0.9 % IR SOLN
Status: DC | PRN
  Administered 2018-01-15: 3000 mL

## 2018-01-15 MED ORDER — FENTANYL CITRATE (PF) 100 MCG/2ML IJ SOLN
INTRAMUSCULAR | Status: DC | PRN
  Administered 2018-01-15 (×3): 50 ug via INTRAVENOUS

## 2018-01-15 MED ORDER — LACTATED RINGERS IV SOLN
INTRAVENOUS | Status: DC | PRN
  Administered 2018-01-15: 08:00:00 via INTRAVENOUS

## 2018-01-15 MED ORDER — MIDAZOLAM HCL 2 MG/2ML IJ SOLN
INTRAMUSCULAR | Status: AC
  Filled 2018-01-15: qty 2

## 2018-01-15 MED ORDER — PROPOFOL 10 MG/ML IV BOLUS
INTRAVENOUS | Status: AC
  Filled 2018-01-15: qty 20

## 2018-01-15 MED ORDER — MIDAZOLAM HCL 5 MG/5ML IJ SOLN
INTRAMUSCULAR | Status: DC | PRN
  Administered 2018-01-15: 2 mg via INTRAVENOUS

## 2018-01-15 MED ORDER — ONDANSETRON HCL 4 MG/2ML IJ SOLN
INTRAMUSCULAR | Status: DC | PRN
  Administered 2018-01-15: 4 mg via INTRAVENOUS

## 2018-01-15 MED ORDER — CEFAZOLIN SODIUM-DEXTROSE 2-4 GM/100ML-% IV SOLN
2.0000 g | INTRAVENOUS | Status: AC
  Administered 2018-01-15: 2 g via INTRAVENOUS
  Filled 2018-01-15: qty 100

## 2018-01-15 MED ORDER — PROPOFOL 10 MG/ML IV BOLUS
INTRAVENOUS | Status: DC | PRN
  Administered 2018-01-15: 220 mg via INTRAVENOUS
  Administered 2018-01-15: 40 mg via INTRAVENOUS

## 2018-01-15 MED ORDER — SUGAMMADEX SODIUM 200 MG/2ML IV SOLN
INTRAVENOUS | Status: AC
  Filled 2018-01-15: qty 2

## 2018-01-15 MED ORDER — FENTANYL CITRATE (PF) 100 MCG/2ML IJ SOLN
INTRAMUSCULAR | Status: AC
  Filled 2018-01-15: qty 2

## 2018-01-15 MED ORDER — DEXAMETHASONE SODIUM PHOSPHATE 10 MG/ML IJ SOLN
INTRAMUSCULAR | Status: AC
  Filled 2018-01-15: qty 1

## 2018-01-15 MED ORDER — LABETALOL HCL 5 MG/ML IV SOLN
5.0000 mg | INTRAVENOUS | Status: AC | PRN
  Administered 2018-01-15 (×2): 5 mg via INTRAVENOUS

## 2018-01-15 MED ORDER — ONDANSETRON HCL 4 MG/2ML IJ SOLN
INTRAMUSCULAR | Status: AC
  Filled 2018-01-15: qty 2

## 2018-01-15 MED ORDER — SUCCINYLCHOLINE CHLORIDE 20 MG/ML IJ SOLN
INTRAMUSCULAR | Status: DC | PRN
  Administered 2018-01-15: 100 mg via INTRAVENOUS

## 2018-01-15 MED ORDER — ROCURONIUM BROMIDE 100 MG/10ML IV SOLN
INTRAVENOUS | Status: DC | PRN
  Administered 2018-01-15: 5 mg via INTRAVENOUS
  Administered 2018-01-15: 20 mg via INTRAVENOUS

## 2018-01-15 MED ORDER — SUGAMMADEX SODIUM 200 MG/2ML IV SOLN
INTRAVENOUS | Status: DC | PRN
  Administered 2018-01-15: 200 mg via INTRAVENOUS

## 2018-01-15 MED ORDER — 0.9 % SODIUM CHLORIDE (POUR BTL) OPTIME
TOPICAL | Status: DC | PRN
  Administered 2018-01-15: 1000 mL

## 2018-01-15 MED ORDER — FENTANYL CITRATE (PF) 100 MCG/2ML IJ SOLN
25.0000 ug | INTRAMUSCULAR | Status: DC | PRN
  Administered 2018-01-15 (×2): 50 ug via INTRAVENOUS

## 2018-01-15 MED ORDER — LIDOCAINE 2% (20 MG/ML) 5 ML SYRINGE
INTRAMUSCULAR | Status: DC | PRN
  Administered 2018-01-15: 100 mg via INTRAVENOUS

## 2018-01-15 MED ORDER — ROCURONIUM BROMIDE 10 MG/ML (PF) SYRINGE
PREFILLED_SYRINGE | INTRAVENOUS | Status: AC
  Filled 2018-01-15: qty 5

## 2018-01-15 MED ORDER — LACTATED RINGERS IV SOLN
INTRAVENOUS | Status: DC
  Administered 2018-01-15: 1000 mL via INTRAVENOUS

## 2018-01-15 MED ORDER — SUCCINYLCHOLINE CHLORIDE 200 MG/10ML IV SOSY
PREFILLED_SYRINGE | INTRAVENOUS | Status: AC
  Filled 2018-01-15: qty 10

## 2018-01-15 MED ORDER — LABETALOL HCL 5 MG/ML IV SOLN
INTRAVENOUS | Status: AC
  Administered 2018-01-15: 5 mg
  Filled 2018-01-15: qty 4

## 2018-01-15 MED ORDER — DEXAMETHASONE SODIUM PHOSPHATE 10 MG/ML IJ SOLN
INTRAMUSCULAR | Status: DC | PRN
  Administered 2018-01-15: 10 mg via INTRAVENOUS

## 2018-01-15 SURGICAL SUPPLY — 22 items
BAG URO CATCHER STRL LF (MISCELLANEOUS) ×2 IMPLANT
BASKET LASER NITINOL 1.9FR (BASKET) IMPLANT
BASKET ZERO TIP NITINOL 2.4FR (BASKET) ×2 IMPLANT
CATH INTERMIT  6FR 70CM (CATHETERS) IMPLANT
CATH URET 5FR 28IN CONE TIP (BALLOONS)
CATH URET 5FR 70CM CONE TIP (BALLOONS) IMPLANT
CLOTH BEACON ORANGE TIMEOUT ST (SAFETY) ×2 IMPLANT
COVER FOOTSWITCH UNIV (MISCELLANEOUS) ×2 IMPLANT
FIBER LASER FLEXIVA 365 (UROLOGICAL SUPPLIES) IMPLANT
FIBER LASER TRAC TIP (UROLOGICAL SUPPLIES) ×2 IMPLANT
GOWN STRL REUS W/TWL LRG LVL3 (GOWN DISPOSABLE) ×4 IMPLANT
GUIDEWIRE ANG ZIPWIRE 038X150 (WIRE) IMPLANT
GUIDEWIRE STR DUAL SENSOR (WIRE) ×2 IMPLANT
MANIFOLD NEPTUNE II (INSTRUMENTS) ×2 IMPLANT
PACK CYSTO (CUSTOM PROCEDURE TRAY) ×2 IMPLANT
SHEATH ACCESS URETERAL 24CM (SHEATH) IMPLANT
SHEATH ACCESS URETERAL 54CM (SHEATH) IMPLANT
SHEATH URETERAL 12FRX35CM (MISCELLANEOUS) ×2 IMPLANT
STENT URET 6FRX26 CONTOUR (STENTS) ×2 IMPLANT
SYRINGE 10CC LL (SYRINGE) ×2 IMPLANT
TUBE FEEDING 8FR 16IN STR KANG (MISCELLANEOUS) IMPLANT
TUBING UROLOGY SET (TUBING) ×2 IMPLANT

## 2018-01-15 NOTE — Anesthesia Postprocedure Evaluation (Signed)
Anesthesia Post Note  Patient: Johnny Barker  Procedure(s) Performed: CYSTOSCOPY WITH BILATERAL RETROGRADE PYELOGRAM, URETEROSCOPY LASER LITHO AND RIGHT STENT PLACEMENT (Bilateral ) HOLMIUM LASER APPLICATION (Bilateral )     Patient location during evaluation: PACU Anesthesia Type: General Level of consciousness: awake Pain management: pain level controlled Vital Signs Assessment: post-procedure vital signs reviewed and stable Respiratory status: spontaneous breathing Cardiovascular status: stable Anesthetic complications: no    Last Vitals:  Vitals:   01/15/18 1327 01/15/18 1400  BP: (!) 173/116 (!) 175/110  Pulse: 81 81  Resp:    Temp: 36.9 C 36.9 C  SpO2: 96% 96%    Last Pain:  Vitals:   01/15/18 1230  TempSrc:   PainSc: 3                  [name]

## 2018-01-15 NOTE — Interval H&P Note (Signed)
History and Physical Interval Note:  01/15/2018 8:20 AM  Johnny Barker  has presented today for surgery, with the diagnosis of BILATERAL URETERAL STONES  The various methods of treatment have been discussed with the patient and family. After consideration of risks, benefits and other options for treatment, the patient has consented to  Procedure(s): CYSTOSCOPY WITH BILATERAL RETROGRADE PYELOGRAM, URETEROSCOPY [place] (Bilateral) HOLMIUM LASER APPLICATION (Bilateral) as a surgical intervention .  The patient's history has been reviewed, patient examined, no change in status, stable for surgery.  I have reviewed the patient's chart and labs.  Questions were answered to the patient's satisfaction.     Marton Redwood, III

## 2018-01-15 NOTE — Anesthesia Procedure Notes (Signed)
Procedure Name: Intubation Date/Time: 01/15/2018 8:42 AM Performed by: Victoriano Lain, CRNA Pre-anesthesia Checklist: Patient identified, Emergency Drugs available, Suction available, Patient being monitored and Timeout performed Patient Re-evaluated:Patient Re-evaluated prior to induction Oxygen Delivery Method: Circle system utilized Preoxygenation: Pre-oxygenation with 100% oxygen Induction Type: IV induction Ventilation: Mask ventilation without difficulty Laryngoscope Size: Mac and 4 Grade View: Grade II Tube type: Oral Number of attempts: 1 Airway Equipment and Method: Stylet Placement Confirmation: ETT inserted through vocal cords under direct vision,  positive ETCO2 and breath sounds checked- equal and bilateral Secured at: 22 cm Tube secured with: Tape Dental Injury: Teeth and Oropharynx as per pre-operative assessment  Comments: PreO2. Smooth IV induction. #4 Lubricated Proseal LMA plqaced with ease. Large leak noted, unable to obtain a good seal. Masked. VSS.#4 lubricated LMA placed with ease. Leak noted. LMA removed after unable to obtain a seal. Masked. DL x 1 with MAC 4. Grade 2 view. ATOI. BBS=. Ett secured.

## 2018-01-15 NOTE — Op Note (Signed)
Operative Note  Preoperative diagnosis:  1.  Bilateral ureteral calculi  Postoperative diagnosis: 1.  Bilateral ureteral calculi  Procedure(s): 1.  Cystoscopy with left retrograde pyelogram, left ureteral stent removal, right retrograde pyelogram, right ureteroscopy with laser lithotripsy and stone basketing, right ureteral stent exchange, fluoroscopy less than 1 hour  Surgeon: Link Snuffer, MD  Assistants: None  Anesthesia: General  Complications: None immediate  EBL: Minimal  Specimens: 1.  Renal calculus  Drains/Catheters: 1.  6 x 26 double-J ureteral stent  Intraoperative findings: 1.  Normal urethra and bladder 2.  Left retrograde pyelogram revealed no filling defects.  No evidence of residual ureteral stones. 3.  Right retrograde pyelogram revealed right hydroureteronephrosis but no filling defect. 4.  Right residual ureteral calculi fragmented to less than 1 mm fragments and other larger fragments were basket extracted.  Indication: 47 year old male with a history of bilateral ureteral calculi status post bilateral ureteroscopy with laser lithotripsy.  All left ureteral calculi were fragmented but not all right ureteral calculi could be reached.  He presents for left ureteral stent removal and right ureteroscopy with laser lithotripsy.  Description of procedure:  The patient was identified and consent was obtained.  The patient was taken to the operating room and placed in the supine position.  The patient was placed under general anesthesia.  Perioperative antibiotics were administered.  The patient was placed in dorsal lithotomy.  Patient was prepped and draped in a standard sterile fashion and a timeout was performed.  A 21 French rigid cystoscope was advanced into the urethra and into the bladder.  The left ureteral stent was grasped and removed.  The scope was reinserted and a open-ended ureteral catheter was advanced into the ureteral orifice on the left and a  retrograde pyelogram was performed.  The findings are noted above.  The right stent was then removed until it was just beyond the urethral meatus and a sensor wire was advanced up to the right kidney under fluoroscopic guidance.  The remainder of the stent was removed.  A semirigid ureteroscope was advanced alongside the wire with no stone fragments in the distal ureter.  There were some stone fragments in the very proximal ureter that could not be reached with the semirigid ureteroscope.  I shot a retrograde pyelogram through the scope and then advanced a sensor wire through the scope and into the kidney.  The scope was withdrawn and a 12 x 14 access sheath was advanced over the wire under continuous fluoroscopic guidance.  Flexible ureteroscopy identified the stone of interest which was fragmented to smaller fragments.  Larger fragments were basket extracted and collected for specimen.  CT scan shows once no other significant stone fragments were seen, the scope along with the access sheath was withdrawn.  There was some minor trauma along the course of the ureter as a result of sheath placement but no significant injury was seen.  There was also some edema surrounding the area of the impacted stone in the proximal ureter.  No ureteral calculi were identified when the scope was withdrawn.  A 6 x 26 double-J ureteral stent was advanced over the wire under fluoroscopic guidance with the string in place and the wire was withdrawn.  Fluoroscopy confirmed proximal and distal placement.  I then drained the bladder and this concluded the operation.  The patient tolerated the procedure well and was stable postoperatively.    Plan: He will remove his stent on Monday morning.  He will need to return and  see me in 6 weeks for a KUB and renal ultrasound.  We will also discuss obtaining a 24-hour urinalysis stone risk profile.

## 2018-01-15 NOTE — Anesthesia Preprocedure Evaluation (Signed)
Anesthesia Evaluation  Patient identified by MRN, date of birth, ID band Patient awake    Reviewed: Allergy & Precautions, NPO status   Airway Mallampati: II  TM Distance: >3 FB     Dental   Pulmonary neg pulmonary ROS,    breath sounds clear to auscultation       Cardiovascular hypertension,  Rhythm:Regular Rate:Normal     Neuro/Psych    GI/Hepatic Neg liver ROS, GERD  ,  Endo/Other  negative endocrine ROS  Renal/GU negative Renal ROS     Musculoskeletal   Abdominal   Peds  Hematology   Anesthesia Other Findings   Reproductive/Obstetrics                             Anesthesia Physical Anesthesia Plan  ASA: III  Anesthesia Plan: General   Post-op Pain Management:    Induction: Intravenous  PONV Risk Score and Plan: 2 and Ondansetron, Dexamethasone, Treatment may vary due to age or medical condition and Midazolam  Airway Management Planned: LMA  Additional Equipment:   Intra-op Plan:   Post-operative Plan: Extubation in OR  Informed Consent: I have reviewed the patients History and Physical, chart, labs and discussed the procedure including the risks, benefits and alternatives for the proposed anesthesia with the patient or authorized representative who has indicated his/her understanding and acceptance.   Dental advisory given  Plan Discussed with: CRNA and Anesthesiologist  Anesthesia Plan Comments:         Anesthesia Quick Evaluation

## 2018-01-15 NOTE — Transfer of Care (Signed)
Immediate Anesthesia Transfer of Care Note  Patient: [facility]  Procedure(s) Performed: CYSTOSCOPY WITH BILATERAL RETROGRADE PYELOGRAM, URETEROSCOPY LASER LITHO AND RIGHT STENT PLACEMENT (Bilateral ) HOLMIUM LASER APPLICATION (Bilateral )  Patient Location: PACU  Anesthesia Type:General  Level of Consciousness: awake, alert , oriented and patient cooperative  Airway & Oxygen Therapy: Patient Spontanous Breathing and Patient connected to face mask oxygen  Post-op Assessment: Report given to RN, Post -op Vital signs reviewed and stable and Patient moving all extremities  Post vital signs: Reviewed and stable  Last Vitals:  Vitals:   01/15/18 0657  BP: (!) 179/109  Pulse: 79  Resp: 18  Temp: 36.8 C  SpO2: 100%    Last Pain:  Vitals:   01/15/18 0657  TempSrc: Oral      Patients Stated Pain Goal: 3 (12/75/17 0017)  Complications: No apparent anesthesia complications

## 2018-01-15 NOTE — Discharge Instructions (Signed)

## 2018-01-16 DIAGNOSIS — I10 Essential (primary) hypertension: Secondary | ICD-10-CM | POA: Diagnosis not present

## 2018-02-25 DIAGNOSIS — N201 Calculus of ureter: Secondary | ICD-10-CM | POA: Diagnosis not present

## 2018-03-04 DIAGNOSIS — N200 Calculus of kidney: Secondary | ICD-10-CM | POA: Diagnosis not present

## 2018-05-20 DIAGNOSIS — I10 Essential (primary) hypertension: Secondary | ICD-10-CM | POA: Diagnosis not present

## 2018-11-21 DIAGNOSIS — I10 Essential (primary) hypertension: Secondary | ICD-10-CM | POA: Diagnosis not present

## 2018-11-21 DIAGNOSIS — B351 Tinea unguium: Secondary | ICD-10-CM | POA: Diagnosis not present

## 2018-11-21 DIAGNOSIS — M79672 Pain in left foot: Secondary | ICD-10-CM | POA: Diagnosis not present

## 2020-05-14 ENCOUNTER — Other Ambulatory Visit: Payer: Self-pay

## 2020-05-14 ENCOUNTER — Encounter (HOSPITAL_BASED_OUTPATIENT_CLINIC_OR_DEPARTMENT_OTHER): Payer: Self-pay

## 2020-05-14 ENCOUNTER — Emergency Department (HOSPITAL_BASED_OUTPATIENT_CLINIC_OR_DEPARTMENT_OTHER)
Admission: EM | Admit: 2020-05-14 | Discharge: 2020-05-15 | Disposition: A | Discharge status: Home / Self Care | Payer: 59 | Attending: Emergency Medicine | Admitting: Emergency Medicine

## 2020-05-14 DIAGNOSIS — M25511 Pain in right shoulder: Secondary | ICD-10-CM | POA: Diagnosis present

## 2020-05-14 DIAGNOSIS — Y9289 Other specified places as the place of occurrence of the external cause: Secondary | ICD-10-CM | POA: Insufficient documentation

## 2020-05-14 DIAGNOSIS — S161XXA Strain of muscle, fascia and tendon at neck level, initial encounter: Secondary | ICD-10-CM

## 2020-05-14 DIAGNOSIS — S39012A Strain of muscle, fascia and tendon of lower back, initial encounter: Secondary | ICD-10-CM | POA: Diagnosis not present

## 2020-05-14 DIAGNOSIS — Y999 Unspecified external cause status: Secondary | ICD-10-CM | POA: Diagnosis not present

## 2020-05-14 DIAGNOSIS — Y939 Activity, unspecified: Secondary | ICD-10-CM | POA: Insufficient documentation
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
## 2020-05-15 ENCOUNTER — Emergency Department (HOSPITAL_BASED_OUTPATIENT_CLINIC_OR_DEPARTMENT_OTHER): Payer: 59

## 2020-05-15 MED ORDER — TRAMADOL HCL 50 MG PO TABS: 50.0000 mg | ORAL_TABLET | Freq: Four times a day (QID) | ORAL | 0 refills | Status: AC | PRN

## 2020-05-15 MED ORDER — IBUPROFEN 800 MG PO TABS: 800.0000 mg | ORAL_TABLET | Freq: Four times a day (QID) | ORAL | 0 refills | Status: AC | PRN

## 2020-05-15 MED ORDER — METHOCARBAMOL 500 MG PO TABS: 500.0000 mg | ORAL_TABLET | Freq: Three times a day (TID) | ORAL | 0 refills | Status: DC | PRN
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
## 2023-08-16 ENCOUNTER — Ambulatory Visit
Admission: EM | Admit: 2023-08-16 | Discharge: 2023-08-16 | Disposition: A | Discharge status: Home / Self Care | Payer: BC Managed Care – PPO | Attending: Internal Medicine | Admitting: Internal Medicine

## 2023-08-16 DIAGNOSIS — M25512 Pain in left shoulder: Secondary | ICD-10-CM

## 2023-08-16 DIAGNOSIS — R5383 Other fatigue: Secondary | ICD-10-CM | POA: Diagnosis not present

## 2023-08-16 MED ORDER — METHOCARBAMOL 500 MG PO TABS: 500.0000 mg | ORAL_TABLET | Freq: Two times a day (BID) | ORAL | 0 refills | Status: AC | PRN

## 2023-08-16 NOTE — ED Triage Notes (Signed)
Pt states pain between his shoulder blades worse on the left. Also states he is feeling extra tired today.

## 2023-08-16 NOTE — ED Provider Notes (Addendum)
[facility]    CSN: [id number] Arrival date & time: 08/16/23  1702      History   Chief Complaint Chief Complaint  Patient presents with   Back Pain    HPI Johnny Barker is a 52 y.o. male.   Patient presents with pain that starts at his mid shoulder blade that radiates to the left side and around to his left lateral chest.  States that he has also has some abdominal "tightness".  All of these symptoms started today with increasing fatigue.  Denies headache, dizziness, blurred vision, chest pain, shortness of breath.  States that he has a previous history of hypertension and is supposed to be taking lisinopril and amlodipine but has not taken medication in 2 years.  Denies any injury to the area. He denies that he does a lot of heavy lifting for work. Has been having normal bowel movements and urinating appropriately.    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   Intra-abdominal hematoma 06/11/2015    Past Surgical History:  Procedure Laterality Date   CYSTOSCOPY WITH RETROGRADE PYELOGRAM, URETEROSCOPY AND STENT PLACEMENT Bilateral 12/16/2017   Procedure: CYSTOSCOPY, BILATERAL URETEROSCOPY, BILATERAL LASER LITHOTRIPSY  AND BILATERAL URETERAL STENT PLACEMENT;  Surgeon: Crista Elliot, MD;  Location: [facility];  Service: Urology;  Laterality: Bilateral;   CYSTOSCOPY WITH RETROGRADE PYELOGRAM, URETEROSCOPY AND STENT PLACEMENT Bilateral 01/15/2018   Procedure: CYSTOSCOPY WITH BILATERAL RETROGRADE PYELOGRAM, URETEROSCOPY LASER LITHO AND RIGHT STENT PLACEMENT;  Surgeon: Crista Elliot, MD;  Location: [facility];  Service: Urology;  Laterality: Bilateral;   HOLMIUM LASER APPLICATION Bilateral 01/15/2018   Procedure: HOLMIUM LASER APPLICATION;  Surgeon: Crista Elliot,  MD;  Location: [facility];  Service: Urology;  Laterality: Bilateral;   KNEE SURGERY Right arthroscopy   SPLENECTOMY, PARTIAL     Age 18       Home Medications    Prior to Admission medications   Medication Sig Start Date End Date Taking? Authorizing Provider  methocarbamol (ROBAXIN) 500 MG tablet Take 1 tablet (500 mg total) by mouth 2 (two) times daily as needed for muscle spasms. 08/16/23  Yes Jeanene Mena, Acie Fredrickson, FNP  aspirin-acetaminophen-caffeine (EXCEDRIN MIGRAINE) [phone] MG tablet Take 1 tablet by mouth every 6 (six) hours as needed for headache or migraine.     [provider]  fluocinonide cream (LIDEX) 0.05 % Apply 1 application topically daily as needed (for affected areas of skin on hands (typically uses once a week)).    [provider]  ibuprofen (ADVIL) 800 MG tablet Take 1 tablet (800 mg total) by mouth every 6 (six) hours as needed for moderate pain. 05/15/20   Gilda Crease, MD  omeprazole (PRILOSEC) 20 MG capsule Take 20 mg by mouth daily as needed (for acid reflux).     [provider]  oxybutynin (DITROPAN) 5 MG tablet Take 1 tablet (5 mg total) by mouth every 8 (eight) hours as needed for bladder spasms. 12/16/17   Crista Elliot, MD  tamsulosin (FLOMAX) 0.4 MG CAPS capsule Take 1 capsule (0.4 mg total) by mouth daily after breakfast. Patient not taking: Reported on 01/02/2018 12/17/17   Crista Elliot, MD  traMADol (  ULTRAM) 50 MG tablet Take 1 tablet (50 mg total) by mouth every 6 (six) hours as needed. 05/15/20   Gilda Crease, MD    Family History Family History  Problem Relation Age of Onset   Hypertension Mother    Multiple sclerosis Sister    Hypertension Maternal Grandmother     Social History Social History   Tobacco Use   Smoking status: Never   Smokeless tobacco: Never  Vaping Use   Vaping status: Never Used  Substance Use Topics   Alcohol use: No   Drug use: No     Allergies   Patient has no known  allergies.   Review of Systems Review of Systems Per HPI  Physical Exam Triage Vital Signs ED Triage Vitals  Encounter Vitals Group     BP 08/16/23 1749 (!) 157/100     Systolic BP Percentile --      Diastolic BP Percentile --      Pulse Rate 08/16/23 1749 (!) 106     Resp 08/16/23 1749 20     Temp 08/16/23 1749 98.7 F (37.1 C)     Temp Source 08/16/23 1749 Oral     SpO2 08/16/23 1749 94 %     Weight --      Height --      Head Circumference --      Peak Flow --      Pain Score 08/16/23 1759 8     Pain Loc --      Pain Education --      Exclude from Growth Chart --    No data found.  Updated Vital Signs BP (!) 157/100 (BP Location: Left Arm)   Pulse (!) 106   Temp 98.7 F (37.1 C) (Oral)   Resp 20   SpO2 94%   Visual Acuity Right Eye Distance:   Left Eye Distance:   Bilateral Distance:    Right Eye Near:   Left Eye Near:    Bilateral Near:     Physical Exam Constitutional:      General: He is not in acute distress.    Appearance: Normal appearance. He is not toxic-appearing or diaphoretic.  HENT:     Head: Normocephalic and atraumatic.  Eyes:     Extraocular Movements: Extraocular movements intact.     Conjunctiva/sclera: Conjunctivae normal.  Cardiovascular:     Rate and Rhythm: Normal rate and regular rhythm.     Pulses: Normal pulses.     Heart sounds: Normal heart sounds.  Pulmonary:     Effort: Pulmonary effort is normal. No respiratory distress.     Breath sounds: Normal breath sounds.  Abdominal:     Hernia: A hernia is present. Hernia is present in the umbilical area.     Comments: Mild tenderness to palpation to left upper quadrant.  Musculoskeletal:       Back:     Comments: Patient reports tenderness to left shoulder is located at circled area on diagram.  It is not reproducible with palpation.  No direct spinal tenderness, crepitus, step-off noted.  Full range of motion of upper extremities present with grip strength 5/5.   Neurological:     General: No focal deficit present.     Mental Status: He is alert and oriented to person, place, and time. Mental status is at baseline.  Psychiatric:        Mood and Affect: Mood normal.        Behavior: Behavior normal.  Thought Content: Thought content normal.        Judgment: Judgment normal.      UC Treatments / Results  Labs (all labs ordered are listed, but only abnormal results are displayed) Labs Reviewed - No data to display  EKG   Radiology No results found.  Procedures Procedures (including critical care time)  Medications Ordered in UC Medications - No data to display  Initial Impression / Assessment and Plan / UC Course  I have reviewed the triage vital signs and the nursing notes.  Pertinent labs & imaging results that were available during my care of the patient were reviewed by me and considered in my medical decision making (see chart for details).     Discussed patient's clinical course and symptoms with supervising physician, Dr. Leonides Grills.  EKG completed.  Dr. Leonides Grills did not have any concerns for acute cardiopulmonary process regarding EKG.  He advised that this seems musculoskeletal as vital signs are stable.  Will treat with muscle relaxer and patient advised of supportive care.  Advised patient muscle relaxer can make him drowsy and do not drive or drink alcohol with taking.  He denies that he takes any other daily medications so this should be safe.  No obvious signs of acute abdomen on exam.  Patient does have umbilical hernia but there is no signs of strangulation or complications related to this.  Patient was advised that if symptoms persist or worsen over the next 24 to 48 hours, he is go straight to the emergency department for further evaluation.  Also advised PCP follow-up for BP management and general surgery follow-up for umbilical hernia.  He states that he has established relationship with general surgery so encouraged  him to call them to schedule an appointment.  Patient verbalized understanding and was agreeable with plan. Final Clinical Impressions(s) / UC Diagnoses   Final diagnoses:  Acute pain of left shoulder  Other fatigue     Discharge Instructions      I have prescribed you a muscle relaxer to see if this to be helpful.  Please be advised that it can make you drowsy so no driving or drinking alcohol with it.  If symptoms persist or worsen, please go straight to the emergency department.     ED Prescriptions     Medication Sig Dispense Auth. Provider   methocarbamol (ROBAXIN) 500 MG tablet Take 1 tablet (500 mg total) by mouth 2 (two) times daily as needed for muscle spasms. 20 tablet [place], Acie Fredrickson, [place]      PDMP not reviewed this encounter.   Gustavus Bryant, [place] 08/16/23 1840    Gustavus Bryant, [place] 08/16/23 [phone]

## 2023-08-16 NOTE — Discharge Instructions (Signed)
I have prescribed you a muscle relaxer to see if this to be helpful.  Please be advised that it can make you drowsy so no driving or drinking alcohol with it.  If symptoms persist or worsen, please go straight to the emergency department.

## 2024-06-04 ENCOUNTER — Other Ambulatory Visit (HOSPITAL_BASED_OUTPATIENT_CLINIC_OR_DEPARTMENT_OTHER): Payer: Self-pay | Admitting: Internal Medicine

## 2024-06-04 DIAGNOSIS — Z136 Encounter for screening for cardiovascular disorders: Secondary | ICD-10-CM

## 2024-06-17 ENCOUNTER — Ambulatory Visit (HOSPITAL_BASED_OUTPATIENT_CLINIC_OR_DEPARTMENT_OTHER)
Admission: RE | Admit: 2024-06-17 | Discharge: 2024-06-17 | Disposition: A | Discharge status: Home / Self Care | Payer: Self-pay | Source: Ambulatory Visit | Attending: Internal Medicine | Admitting: Internal Medicine

## 2024-06-17 DIAGNOSIS — Z136 Encounter for screening for cardiovascular disorders: Secondary | ICD-10-CM | POA: Insufficient documentation

## 2024-12-24 ENCOUNTER — Ambulatory Visit: Admitting: Physical Therapy

## 2024-12-24 ENCOUNTER — Ambulatory Visit: Attending: Sports Medicine | Admitting: Physical Therapy

## 2024-12-24 ENCOUNTER — Other Ambulatory Visit: Payer: Self-pay

## 2024-12-24 ENCOUNTER — Encounter: Payer: Self-pay | Admitting: Physical Therapy

## 2024-12-24 DIAGNOSIS — M542 Cervicalgia: Secondary | ICD-10-CM | POA: Insufficient documentation

## 2024-12-24 DIAGNOSIS — M25511 Pain in right shoulder: Secondary | ICD-10-CM | POA: Diagnosis present

## 2024-12-24 DIAGNOSIS — M5459 Other low back pain: Secondary | ICD-10-CM | POA: Diagnosis present

## 2024-12-24 NOTE — Therapy (Signed)
 " OUTPATIENT PHYSICAL THERAPY CERVICAL EVALUATION   Patient Name: Johnny Barker MRN: [id number] DOB:[birth year], 54 y.o., male Today's Date: 12/24/2024  END OF SESSION:  PT End of Session - 12/24/24 1444     Visit Number 1    Number of Visits 35    Date for Recertification  03/24/25    Authorization Type BCBS          Past Medical History:  Diagnosis Date   GERD (gastroesophageal reflux disease)    Headache    History of kidney stones    Hypertension    not on meds    Past Surgical History:  Procedure Laterality Date   CYSTOSCOPY WITH RETROGRADE PYELOGRAM, URETEROSCOPY AND STENT PLACEMENT Bilateral 12/16/2017   Procedure: CYSTOSCOPY, BILATERAL URETEROSCOPY, BILATERAL LASER LITHOTRIPSY  AND BILATERAL URETERAL STENT PLACEMENT;  Surgeon: Carolee Sherwood Johnny DOUGLAS, MD;  Location: [facility];  Service: Urology;  Laterality: Bilateral;   CYSTOSCOPY WITH RETROGRADE PYELOGRAM, URETEROSCOPY AND STENT PLACEMENT Bilateral 01/15/2018   Procedure: CYSTOSCOPY WITH BILATERAL RETROGRADE PYELOGRAM, URETEROSCOPY LASER LITHO AND RIGHT STENT PLACEMENT;  Surgeon: Carolee Sherwood Johnny DOUGLAS, MD;  Location: [facility];  Service: Urology;  Laterality: Bilateral;   HOLMIUM LASER APPLICATION Bilateral 01/15/2018   Procedure: HOLMIUM LASER APPLICATION;  Surgeon: Carolee Sherwood Johnny DOUGLAS, MD;  Location: [facility];  Service: Urology;  Laterality: Bilateral;   KNEE SURGERY Right arthroscopy   SPLENECTOMY, PARTIAL     Age 48   Patient Active Problem List   Diagnosis Date Noted   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   Intra-abdominal hematoma 06/11/2015    PCP: Verneita Glatter, MD  REFERRING PROVIDER: Curtis, MD  REFERRING DIAG: cervicalgia, shoulder pain  THERAPY DIAG:  Cervicalgia  Other low back pain  Acute pain of right shoulder  Rationale for Evaluation and Treatment: Rehabilitation  ONSET DATE: 11/26/24  SUBJECTIVE:                                                                                                                                                                                                          SUBJECTIVE STATEMENT: Patient reports that he has neck pain, back pain and right shoulder pain that comes and goes, had issues a few years ago, with some narrowing and some spondylosis.   Hand dominance: Right  PERTINENT HISTORY:  GERD, HTN  PAIN:  Are you having pain? Yes: NPRS scale: 4/10 Pain location: neck, right shoulder and back Pain description: ache,  Aggravating factors: work, some pushing, pulling, yardwork, pain up to 8/10 Relieving factors: heat  at best pain a 2/10  PRECAUTIONS: None  RED FLAGS: None     WEIGHT BEARING RESTRICTIONS: No  FALLS:  Has patient fallen in last 6 months? No  LIVING ENVIRONMENT: Lives with: lives with their family Lives in: House/apartment Stairs: No Has following equipment at home: None  OCCUPATION: works and [employer], some lifting, some pushing and pulling carts  PLOF: Independent and yard work, no exercise  PATIENT GOALS: have less pain  NEXT MD VISIT: February  OBJECTIVE:  Note: Objective measures were completed at Evaluation unless otherwise noted.  DIAGNOSTIC FINDINGS:  From 2021 IMPRESSION: 1. No acute fracture or traumatic listhesis. 2. 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 3. 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  PATIENT SURVEYS:  NDI = 46%  COGNITION: Overall cognitive status: Within functional limits for tasks assessed  SENSATION: C/o tingling in the right hand  POSTURE: rounded shoulders and forward head  PALPATION: Very tight in the upper traps and neck and lumbar mms   CERVICAL ROM:   Active ROM A/PROM (deg) eval  Flexion Decreased 25%  Extension 50% P!  Right lateral flexion Decreased 25%  Left lateral flexion Decreased 25%  Right rotation Decreased 25%  Left rotation Decreased 25%   (Blank  rows = not tested)  UPPER EXTREMITY ROM:  WFL's mild limitation in all motions with some pain   UPPER EXTREMITY MMT:  MMT Right eval Left eval  Shoulder flexion 5 4-  Shoulder extension    Shoulder abduction 4+ 4-  Shoulder adduction    Shoulder extension    Shoulder internal rotation 4 4  Shoulder external rotation 4 4  Middle trapezius    Lower trapezius    Elbow flexion    Elbow extension    Wrist flexion    Wrist extension    Wrist ulnar deviation    Wrist radial deviation    Wrist pronation    Wrist supination    Grip strength     (Blank rows = not tested)  Lumbar ROM WNL's with some pain for extension and side bending  Left shoulder impingement +, weak empty can,  Right shoulder impingement +  CERVICAL SPECIAL TESTS:  Spurling's test: Positive  TREATMENT DATE:  12/24/24 Evaluation, HEP/POC                                                                                                                                 PATIENT EDUCATION:  Education details: HEP/POC Person educated: Patient Education method: Programmer, Multimedia, Demonstration, Actor cues, Verbal cues, and Handouts Education comprehension: verbalized understanding  HOME EXERCISE PROGRAM: Access Code: [email or web address] URL: [email or web address] Date: 12/24/2024 Prepared by: [name]  Exercises - Supine Lower Trunk Rotation  - 2 x daily - 7 x weekly - 1 sets - 10 reps - 3 hold - Supine Double Knee to Chest Modified  - 2 x daily - 7 x weekly -  1 sets - 10 reps - 3 hold - Doorway Pec Stretch at 90 Degrees Abduction  - 2 x daily - 7 x weekly - 1 sets - 5 reps - 10 hold - Upper Trapezius Stretch  - 2 x daily - 7 x weekly - 1 sets - 5 reps - 10 hold - Seated Table Hamstring Stretch  - 2 x daily - 7 x weekly - 1 sets - 5 reps - 30 hold - Seated Shoulder Shrug Circles AROM Backward  - 2 x daily - 7 x weekly - 1 sets - 10 reps - 3 hold  ASSESSMENT:  CLINICAL IMPRESSION: Patient is a 54  y.o. male who was seen today for physical therapy evaluation and treatment for neck, back and shoulder pain.  A past MRI about 5 years ago showed DDD, some disc bulging and some stenosis.  He reports pain comes and goes, but when he has it he cannot sleep and it limits his ability.  He is very tight in the mms of the upper trap, neck and back, + Spurling's test, pain and weak empty can test on the left, bilateral + impingement tests.  Very poor posture, very tight HS and calves   OBJECTIVE IMPAIRMENTS: cardiopulmonary status limiting activity, decreased activity tolerance, decreased endurance, decreased mobility, difficulty walking, decreased ROM, decreased strength, increased fascial restrictions, increased muscle spasms, impaired flexibility, impaired UE functional use, improper body mechanics, postural dysfunction, and pain.   REHAB POTENTIAL: Good  CLINICAL DECISION MAKING: Stable/uncomplicated  EVALUATION COMPLEXITY: Low   GOALS: Goals reviewed with patient? Yes  SHORT TERM GOALS: Target date: 01/13/25  Independent with initial HEP Baseline:  Goal status: INITIAL  LONG TERM GOALS: Target date: 04/23/25  Independent with advanced HEP Baseline:  Goal status: INITIAL  2.  Understand posture and body mechanics Baseline:  Goal status: INITIAL  3.  Decrease pain overall 50% Baseline:  Goal status: INITIAL  4.  Improve NDI to 20% Baseline: 46% Goal status: INITIAL  5.  Improve cervical ROM 25% Baseline:  Goal status: INITIAL  PLAN:  PT FREQUENCY: 1-2x/week  PT DURATION: 12 weeks  PLANNED INTERVENTIONS: 97164- PT Re-evaluation, 97110-Therapeutic exercises, 97530- Therapeutic activity, 97112- Neuromuscular re-education, 97535- Self Care, [id number]- Manual therapy, 97012- Traction (mechanical), Patient/Family education, Taping, Joint mobilization, Spinal mobilization, Cryotherapy, and Moist heat  PLAN FOR NEXT SESSION: gym, flexibility, posture and body  mechanics   Atzel Mccambridge W, PT 12/24/2024, 2:45 PM      "

## 2024-12-31 ENCOUNTER — Ambulatory Visit: Admitting: Physical Therapy

## 2025-01-07 ENCOUNTER — Encounter: Payer: Self-pay | Admitting: Physical Therapy

## 2025-01-07 ENCOUNTER — Ambulatory Visit: Admitting: Physical Therapy

## 2025-01-07 DIAGNOSIS — M542 Cervicalgia: Secondary | ICD-10-CM

## 2025-01-07 DIAGNOSIS — M25511 Pain in right shoulder: Secondary | ICD-10-CM

## 2025-01-07 DIAGNOSIS — M5459 Other low back pain: Secondary | ICD-10-CM

## 2025-01-07 NOTE — Therapy (Signed)
 " OUTPATIENT PHYSICAL THERAPY CERVICAL TREATMENT   Patient Name: Johnny Barker MRN: [id number] DOB:04/13/1971, 54 y.o., male Today's Date: 01/07/2025  END OF SESSION:  PT End of Session - 01/07/25 1349     Visit Number 2    Date for Recertification  03/24/25    PT Start Time 1349    PT Stop Time 1430    PT Time Calculation (min) 41 min    Activity Tolerance Patient tolerated treatment well    Behavior During Therapy WFL for tasks assessed/performed          Past Medical History:  Diagnosis Date   GERD (gastroesophageal reflux disease)    Headache    History of kidney stones    Hypertension    not on meds    Past Surgical History:  Procedure Laterality Date   CYSTOSCOPY WITH RETROGRADE PYELOGRAM, URETEROSCOPY AND STENT PLACEMENT Bilateral 12/16/2017   Procedure: CYSTOSCOPY, BILATERAL URETEROSCOPY, BILATERAL LASER LITHOTRIPSY  AND BILATERAL URETERAL STENT PLACEMENT;  Surgeon: Carolee Sherwood JONETTA DOUGLAS, MD;  Location: [facility];  Service: Urology;  Laterality: Bilateral;   CYSTOSCOPY WITH RETROGRADE PYELOGRAM, URETEROSCOPY AND STENT PLACEMENT Bilateral 01/15/2018   Procedure: CYSTOSCOPY WITH BILATERAL RETROGRADE PYELOGRAM, URETEROSCOPY LASER LITHO AND RIGHT STENT PLACEMENT;  Surgeon: Carolee Sherwood JONETTA DOUGLAS, MD;  Location: [facility];  Service: Urology;  Laterality: Bilateral;   HOLMIUM LASER APPLICATION Bilateral 01/15/2018   Procedure: HOLMIUM LASER APPLICATION;  Surgeon: Carolee Sherwood JONETTA DOUGLAS, MD;  Location: [facility];  Service: Urology;  Laterality: Bilateral;   KNEE SURGERY Right arthroscopy   SPLENECTOMY, PARTIAL     Age 19   Patient Active Problem List   Diagnosis Date Noted   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   Intra-abdominal hematoma 06/11/2015    PCP: Verneita Glatter, MD  REFERRING PROVIDER: Curtis, MD  REFERRING DIAG: cervicalgia, shoulder pain  THERAPY DIAG:  Cervicalgia  Other low back pain  Acute pain of right shoulder  Rationale for Evaluation and Treatment: Rehabilitation  ONSET  DATE: 11/26/24  SUBJECTIVE:                                                                                                                                                                                                         SUBJECTIVE STATEMENT: Shoved driveway yesterday, felt it last night Hand dominance: Right  PERTINENT HISTORY:  GERD, HTN  PAIN:  Are you having pain? Yes: NPRS scale: 0/10 Pain location: neck, right shoulder and back Pain description: ache,  Aggravating factors: work, some pushing, pulling, yardwork, pain up to 8/10 Relieving factors: heat  at  best pain a 2/10  PRECAUTIONS: None  RED FLAGS: None     WEIGHT BEARING RESTRICTIONS: No  FALLS:  Has patient fallen in last 6 months? No  LIVING ENVIRONMENT: Lives with: lives with their family Lives in: House/apartment Stairs: No Has following equipment at home: None  OCCUPATION: works and [employer], some lifting, some pushing and pulling carts  PLOF: Independent and yard work, no exercise  PATIENT GOALS: have less pain  NEXT MD VISIT: February  OBJECTIVE:  Note: Objective measures were completed at Evaluation unless otherwise noted.  DIAGNOSTIC FINDINGS:  From 2021 IMPRESSION: 1. No acute fracture or traumatic listhesis. 2. 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 3. 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  PATIENT SURVEYS:  NDI = 46%  COGNITION: Overall cognitive status: Within functional limits for tasks assessed  SENSATION: C/o tingling in the right hand  POSTURE: rounded shoulders and forward head  PALPATION: Very tight in the upper traps and neck and lumbar mms   CERVICAL ROM:   Active ROM A/PROM (deg) eval  Flexion Decreased 25%  Extension 50% P!  Right lateral flexion Decreased 25%  Left lateral flexion Decreased 25%  Right rotation Decreased 25%  Left rotation  Decreased 25%   (Blank rows = not tested)  UPPER EXTREMITY ROM:  WFL's mild limitation in all motions with some pain   UPPER EXTREMITY MMT:  MMT Right eval Left eval  Shoulder flexion 5 4-  Shoulder extension    Shoulder abduction 4+ 4-  Shoulder adduction    Shoulder extension    Shoulder internal rotation 4 4  Shoulder external rotation 4 4  Middle trapezius    Lower trapezius    Elbow flexion    Elbow extension    Wrist flexion    Wrist extension    Wrist ulnar deviation    Wrist radial deviation    Wrist pronation    Wrist supination    Grip strength     (Blank rows = not tested)  Lumbar ROM WNL's with some pain for extension and side bending  Left shoulder impingement +, weak empty can,  Right shoulder impingement +  CERVICAL SPECIAL TESTS:  Spurling's test: Positive  TREATMENT DATE:  01/07/25 NuStep L 5 x 6 min Felt the lateral R knee  Shoulder Ext 15lb 2x10 Standing rows 15lb 2x10 Slant board calf stretch S2S OHP yellow wball 2x10 Bridges x10 LE on Pball bridges, oblq, K2C  Passive stretching to HS, K2C, and piriformis   12/24/24 Evaluation, HEP/POC                                                                                                                                 PATIENT EDUCATION:  Education details: HEP/POC Person educated: Patient Education method: Explanation, Demonstration, Actor cues, Verbal cues, and Handouts Education comprehension: verbalized understanding  HOME EXERCISE PROGRAM:  Access Code: [email or web address] URL: [email or web address] Date: 12/24/2024 Prepared by: [name]  Exercises - Supine Lower Trunk Rotation  - 2 x daily - 7 x weekly - 1 sets - 10 reps - 3 hold - Supine Double Knee to Chest Modified  - 2 x daily - 7 x weekly - 1 sets - 10 reps - 3 hold - Doorway Pec Stretch at 90 Degrees Abduction  - 2 x daily - 7 x weekly - 1 sets - 5 reps - 10 hold - Upper Trapezius Stretch  - 2 x daily - 7 x  weekly - 1 sets - 5 reps - 10 hold - Seated Table Hamstring Stretch  - 2 x daily - 7 x weekly - 1 sets - 5 reps - 30 hold - Seated Shoulder Shrug Circles AROM Backward  - 2 x daily - 7 x weekly - 1 sets - 10 reps - 3 hold  ASSESSMENT:  CLINICAL IMPRESSION: Patient is a 54 y.o. male who was seen today for physical therapy treatment for neck, back and shoulder pain.  A past MRI about 5 years ago showed DDD, some disc bulging and some stenosis.  He reports pain comes and goes, but when he has it he cannot sleep and it limits his ability.  Session consisted of posterior chain strengthening. Tactiel cue required with shoulder Ext and rows.  Cue to increase elevation with bridges.  Very poor posture, very tight HS and calves   OBJECTIVE IMPAIRMENTS: cardiopulmonary status limiting activity, decreased activity tolerance, decreased endurance, decreased mobility, difficulty walking, decreased ROM, decreased strength, increased fascial restrictions, increased muscle spasms, impaired flexibility, impaired UE functional use, improper body mechanics, postural dysfunction, and pain.   REHAB POTENTIAL: Good  CLINICAL DECISION MAKING: Stable/uncomplicated  EVALUATION COMPLEXITY: Low   GOALS: Goals reviewed with patient? Yes  SHORT TERM GOALS: Target date: 01/13/25  Independent with initial HEP Baseline:  Goal status: Progressing 01/07/25  LONG TERM GOALS: Target date: 04/23/25  Independent with advanced HEP Baseline:  Goal status: INITIAL  2.  Understand posture and body mechanics Baseline:  Goal status: INITIAL  3.  Decrease pain overall 50% Baseline:  Goal status: INITIAL  4.  Improve NDI to 20% Baseline: 46% Goal status: INITIAL  5.  Improve cervical ROM 25% Baseline:  Goal status: INITIAL  PLAN:  PT FREQUENCY: 1-2x/week  PT DURATION: 12 weeks  PLANNED INTERVENTIONS: 97164- PT Re-evaluation, 97110-Therapeutic exercises, 97530- Therapeutic activity, 97112- Neuromuscular  re-education, 97535- Self Care, [id number]- Manual therapy, 97012- Traction (mechanical), Patient/Family education, Taping, Joint mobilization, Spinal mobilization, Cryotherapy, and Moist heat  PLAN FOR NEXT SESSION: gym, flexibility, posture and body mechanics   Tanda KANDICE Sorrow, PTA 01/07/2025, 1:49 PM      "

## 2025-01-14 ENCOUNTER — Encounter: Payer: Self-pay | Admitting: Physical Therapy

## 2025-01-14 ENCOUNTER — Ambulatory Visit: Admitting: Physical Therapy

## 2025-01-14 DIAGNOSIS — M5459 Other low back pain: Secondary | ICD-10-CM

## 2025-01-14 DIAGNOSIS — M542 Cervicalgia: Secondary | ICD-10-CM

## 2025-01-14 DIAGNOSIS — M25511 Pain in right shoulder: Secondary | ICD-10-CM

## 2025-01-14 NOTE — Therapy (Signed)
 " OUTPATIENT PHYSICAL THERAPY CERVICAL TREATMENT   Patient Name: Johnny Barker MRN: [id number] DOB:01-26-1971, 54 y.o., male Today's Date: 01/14/2025  END OF SESSION:  PT End of Session - 01/14/25 1356     Visit Number 3    Number of Visits 35    Date for Recertification  03/24/25    Authorization Type [employer]    PT Start Time 1356    PT Stop Time 1440    PT Time Calculation (min) 44 min    Activity Tolerance Patient tolerated treatment well    Behavior During Therapy WFL for tasks assessed/performed          Past Medical History:  Diagnosis Date   GERD (gastroesophageal reflux disease)    Headache    History of kidney stones    Hypertension    not on meds    Past Surgical History:  Procedure Laterality Date   CYSTOSCOPY WITH RETROGRADE PYELOGRAM, URETEROSCOPY AND STENT PLACEMENT Bilateral 12/16/2017   Procedure: CYSTOSCOPY, BILATERAL URETEROSCOPY, BILATERAL LASER LITHOTRIPSY  AND BILATERAL URETERAL STENT PLACEMENT;  Surgeon: Carolee Sherwood JONETTA DOUGLAS, MD;  Location: [facility];  Service: Urology;  Laterality: Bilateral;   CYSTOSCOPY WITH RETROGRADE PYELOGRAM, URETEROSCOPY AND STENT PLACEMENT Bilateral 01/15/2018   Procedure: CYSTOSCOPY WITH BILATERAL RETROGRADE PYELOGRAM, URETEROSCOPY LASER LITHO AND RIGHT STENT PLACEMENT;  Surgeon: Carolee Sherwood JONETTA DOUGLAS, MD;  Location: [facility];  Service: Urology;  Laterality: Bilateral;   HOLMIUM LASER APPLICATION Bilateral 01/15/2018   Procedure: HOLMIUM LASER APPLICATION;  Surgeon: Carolee Sherwood JONETTA DOUGLAS, MD;  Location: [facility];  Service: Urology;  Laterality: Bilateral;   KNEE SURGERY Right arthroscopy   SPLENECTOMY, PARTIAL     Age 75   Patient Active Problem List   Diagnosis Date Noted   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   Intra-abdominal hematoma 06/11/2015    PCP: Verneita Glatter, MD  REFERRING PROVIDER: Curtis, MD  REFERRING DIAG: cervicalgia, shoulder pain  THERAPY DIAG:  Cervicalgia  Other low back pain  Acute pain of right shoulder  Rationale  for Evaluation and Treatment: Rehabilitation  ONSET DATE: 11/26/24  SUBJECTIVE:                                                                                                                                                                                                         SUBJECTIVE STATEMENT: Doing okay a little soreness Hand dominance: Right  PERTINENT HISTORY:  GERD, HTN  PAIN:  Are you having pain? Yes: NPRS scale: 0/10 Pain location: neck, right shoulder and back Pain description: ache,  Aggravating factors: work, some pushing,  pulling, yardwork, pain up to 8/10 Relieving factors: heat  at best pain a 2/10  PRECAUTIONS: None  RED FLAGS: None     WEIGHT BEARING RESTRICTIONS: No  FALLS:  Has patient fallen in last 6 months? No  LIVING ENVIRONMENT: Lives with: lives with their family Lives in: House/apartment Stairs: No Has following equipment at home: None  OCCUPATION: works and [employer], some lifting, some pushing and pulling carts  PLOF: Independent and yard work, no exercise  PATIENT GOALS: have less pain  NEXT MD VISIT: February  OBJECTIVE:  Note: Objective measures were completed at Evaluation unless otherwise noted.  DIAGNOSTIC FINDINGS:  From 2021 IMPRESSION: 1. No acute fracture or traumatic listhesis. 2. 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 3. 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  PATIENT SURVEYS:  NDI = 46%  COGNITION: Overall cognitive status: Within functional limits for tasks assessed  SENSATION: C/o tingling in the right hand  POSTURE: rounded shoulders and forward head  PALPATION: Very tight in the upper traps and neck and lumbar mms   CERVICAL ROM:   Active ROM A/PROM (deg) eval  Flexion Decreased 25%  Extension 50% P!  Right lateral flexion Decreased 25%  Left lateral flexion Decreased 25%  Right  rotation Decreased 25%  Left rotation Decreased 25%   (Blank rows = not tested)  UPPER EXTREMITY ROM:  WFL's mild limitation in all motions with some pain   UPPER EXTREMITY MMT:  MMT Right eval Left eval  Shoulder flexion 5 4-  Shoulder extension    Shoulder abduction 4+ 4-  Shoulder adduction    Shoulder extension    Shoulder internal rotation 4 4  Shoulder external rotation 4 4  Middle trapezius    Lower trapezius    Elbow flexion    Elbow extension    Wrist flexion    Wrist extension    Wrist ulnar deviation    Wrist radial deviation    Wrist pronation    Wrist supination    Grip strength     (Blank rows = not tested)  Lumbar ROM WNL's with some pain for extension and side bending  Left shoulder impingement +, weak empty can,  Right shoulder impingement +  CERVICAL SPECIAL TESTS:  Spurling's test: Positive  TREATMENT DATE:  01/14/25 Nustep level 5 x 6 minutes 20# rows 20# LAts Slant board stretch 15# shoulder extension, cues for posture and core Back to wall red tband ER Back to wall weighted ball OHP Back to wall green tband horizontal abduction Feet on ball K2C, rotation, bridges, isometric abs Passive stretch LE's  01/07/25 NuStep L 5 x 6 min Felt the lateral R knee  Shoulder Ext 15lb 2x10 Standing rows 15lb 2x10 Slant board calf stretch S2S OHP yellow wball 2x10 Bridges x10 LE on Pball bridges, oblq, K2C  Passive stretching to HS, K2C, and piriformis  12/24/24 Evaluation, HEP/POC  PATIENT EDUCATION:  Education details: HEP/POC Person educated: Patient Education method: Explanation, Demonstration, Actor cues, Verbal cues, and Handouts Education comprehension: verbalized understanding  HOME EXERCISE PROGRAM: Access Code: [email or web address] URL: [email or web address] Date: 12/24/2024 Prepared by: [name]  Exercises - Supine Lower Trunk Rotation  - 2 x daily - 7 x weekly - 1 sets - 10 reps - 3 hold - Supine Double Knee to Chest Modified  - 2 x daily - 7 x weekly - 1 sets - 10 reps - 3 hold - Doorway Pec Stretch at 90 Degrees Abduction  - 2 x daily - 7 x weekly - 1 sets - 5 reps - 10 hold - Upper Trapezius Stretch  - 2 x daily - 7 x weekly - 1 sets - 5 reps - 10 hold - Seated Table Hamstring Stretch  - 2 x daily - 7 x weekly - 1 sets - 5 reps - 30 hold - Seated Shoulder Shrug Circles AROM Backward  - 2 x daily - 7 x weekly - 1 sets - 10 reps - 3 hold  ASSESSMENT:  CLINICAL IMPRESSION: We continued to work on postural stability mms and core mms for better support and to decrease stress on shoulders and spine.  He is tight in the LE's, he does have weakness of the core and scapular stabilizers including the RC mms.  He requires cues for posture and core as well as to slow done  Patient is a 54 y.o. male who was seen today for physical therapy treatment for neck, back and shoulder pain.  A past MRI about 5 years ago showed DDD, some disc bulging and some stenosis.  He reports pain comes and goes, but when he has it he cannot sleep and it limits his ability.   OBJECTIVE IMPAIRMENTS: cardiopulmonary status limiting activity, decreased activity tolerance, decreased endurance, decreased mobility, difficulty walking, decreased ROM, decreased strength, increased fascial restrictions, increased muscle spasms, impaired flexibility, impaired UE functional use, improper body mechanics, postural dysfunction, and pain.   REHAB POTENTIAL: Good  CLINICAL DECISION MAKING: Stable/uncomplicated  EVALUATION COMPLEXITY: Low   GOALS: Goals reviewed with patient? Yes  SHORT TERM GOALS: Target date: 01/13/25  Independent with initial HEP Baseline:  Goal status: met 01/14/25  LONG TERM GOALS: Target date: 04/23/25  Independent with advanced HEP Baseline:  Goal status: INITIAL  2.  Understand posture  and body mechanics Baseline:  Goal status: INITIAL  3.  Decrease pain overall 50% Baseline:  Goal status: INITIAL  4.  Improve NDI to 20% Baseline: 46% Goal status: INITIAL  5.  Improve cervical ROM 25% Baseline:  Goal status: INITIAL  PLAN:  PT FREQUENCY: 1-2x/week  PT DURATION: 12 weeks  PLANNED INTERVENTIONS: 97164- PT Re-evaluation, 97110-Therapeutic exercises, 97530- Therapeutic activity, 97112- Neuromuscular re-education, 97535- Self Care, [id number]- Manual therapy, 97012- Traction (mechanical), Patient/Family education, Taping, Joint mobilization, Spinal mobilization, Cryotherapy, and Moist heat  PLAN FOR NEXT SESSION: gym, flexibility, posture and body mechanics   Belynda Pagaduan W, PT 01/14/2025, 1:57 PM      "

## 2025-01-21 ENCOUNTER — Ambulatory Visit: Admitting: Physical Therapy
